# Patient Record
Sex: Female | Born: 1965 | Race: Black or African American | Hispanic: No | Marital: Single | Smoking: Current every day smoker
Health system: Southern US, Community
[De-identification: ages and names within clinical notes are randomized; demographics above are authoritative.]

## PROBLEM LIST (undated history)

## (undated) DIAGNOSIS — R7302 Impaired glucose tolerance (oral): Secondary | ICD-10-CM

## (undated) DIAGNOSIS — G43909 Migraine, unspecified, not intractable, without status migrainosus: Secondary | ICD-10-CM

## (undated) DIAGNOSIS — F419 Anxiety disorder, unspecified: Secondary | ICD-10-CM

## (undated) DIAGNOSIS — Z87442 Personal history of urinary calculi: Secondary | ICD-10-CM

## (undated) DIAGNOSIS — K5732 Diverticulitis of large intestine without perforation or abscess without bleeding: Secondary | ICD-10-CM

## (undated) DIAGNOSIS — L309 Dermatitis, unspecified: Secondary | ICD-10-CM

## (undated) DIAGNOSIS — R42 Dizziness and giddiness: Secondary | ICD-10-CM

## (undated) DIAGNOSIS — K5790 Diverticulosis of intestine, part unspecified, without perforation or abscess without bleeding: Secondary | ICD-10-CM

## (undated) DIAGNOSIS — G5600 Carpal tunnel syndrome, unspecified upper limb: Secondary | ICD-10-CM

## (undated) DIAGNOSIS — I10 Essential (primary) hypertension: Secondary | ICD-10-CM

## (undated) DIAGNOSIS — K219 Gastro-esophageal reflux disease without esophagitis: Secondary | ICD-10-CM

## (undated) DIAGNOSIS — M545 Low back pain, unspecified: Secondary | ICD-10-CM

## (undated) DIAGNOSIS — R002 Palpitations: Secondary | ICD-10-CM

## (undated) HISTORY — PX: INDUCED ABORTION: SHX677

## (undated) HISTORY — DX: Palpitations: R00.2

## (undated) HISTORY — PX: APPENDECTOMY: SHX54

## (undated) HISTORY — DX: Migraine, unspecified, not intractable, without status migrainosus: G43.909

## (undated) HISTORY — PX: OOPHORECTOMY: SHX86

## (undated) HISTORY — DX: Essential (primary) hypertension: I10

## (undated) HISTORY — DX: Diverticulosis of intestine, part unspecified, without perforation or abscess without bleeding: K57.90

## (undated) HISTORY — DX: Carpal tunnel syndrome, unspecified upper limb: G56.00

## (undated) HISTORY — DX: Low back pain, unspecified: M54.50

## (undated) HISTORY — PX: COLONOSCOPY: SHX174

## (undated) HISTORY — DX: Low back pain: M54.5

## (undated) HISTORY — PX: ABDOMINAL HYSTERECTOMY: SHX81

## (undated) HISTORY — DX: Dermatitis, unspecified: L30.9

## (undated) HISTORY — DX: Dizziness and giddiness: R42

## (undated) HISTORY — DX: Impaired glucose tolerance (oral): R73.02

## (undated) HISTORY — DX: Anxiety disorder, unspecified: F41.9

---

## 1996-11-26 HISTORY — PX: APPENDECTOMY: SHX54

## 2011-11-27 DIAGNOSIS — R7302 Impaired glucose tolerance (oral): Secondary | ICD-10-CM | POA: Insufficient documentation

## 2015-06-01 ENCOUNTER — Ambulatory Visit: Payer: Self-pay | Admitting: Family

## 2016-02-24 ENCOUNTER — Other Ambulatory Visit: Payer: Self-pay | Admitting: Nurse Practitioner

## 2016-02-24 DIAGNOSIS — Z1231 Encounter for screening mammogram for malignant neoplasm of breast: Secondary | ICD-10-CM

## 2016-03-26 ENCOUNTER — Emergency Department
Admission: EM | Admit: 2016-03-26 | Discharge: 2016-03-26 | Disposition: A | Discharge status: Home / Self Care | Payer: BLUE CROSS/BLUE SHIELD | Attending: Emergency Medicine | Admitting: Emergency Medicine

## 2016-03-26 ENCOUNTER — Other Ambulatory Visit: Payer: Self-pay

## 2016-03-26 DIAGNOSIS — R42 Dizziness and giddiness: Secondary | ICD-10-CM

## 2016-03-26 DIAGNOSIS — I10 Essential (primary) hypertension: Secondary | ICD-10-CM

## 2016-03-26 LAB — BASIC METABOLIC PANEL
ANION GAP: 10 (ref 5–15)
BUN: 31 mg/dL — AB (ref 6–20)
CALCIUM: 9.2 mg/dL (ref 8.9–10.3)
CO2: 27 mmol/L (ref 22–32)
Chloride: 104 mmol/L (ref 101–111)
Creatinine, Ser: 1.05 mg/dL — ABNORMAL HIGH (ref 0.44–1.00)
GFR calc Af Amer: >60 mL/min (ref >60)
GLUCOSE: 91 mg/dL (ref 65–99)
Potassium: 3.7 mmol/L (ref 3.5–5.1)
Sodium: 141 mmol/L (ref 135–145)

## 2016-03-26 LAB — CBC
HEMATOCRIT: 45.1 % (ref 35.0–47.0)
Hemoglobin: 15.2 g/dL (ref 12.0–16.0)
MCH: 28.1 pg (ref 26.0–34.0)
MCHC: 33.7 g/dL (ref 32.0–36.0)
MCV: 83.3 fL (ref 80.0–100.0)
PLATELETS: 241 10*3/uL (ref 150–440)
RBC: 5.42 MIL/uL — ABNORMAL HIGH (ref 3.80–5.20)
RDW: 16.5 % — AB (ref 11.5–14.5)
WBC: 9.4 10*3/uL (ref 3.6–11.0)

## 2016-03-26 NOTE — Discharge Instructions (Signed)
Dizziness Dizziness is a common problem. It is a feeling of unsteadiness or light-headedness. You may feel like you are about to faint. Dizziness can lead to injury if you stumble or fall. Anyone can become dizzy, but dizziness is more common in older adults. This condition can be caused by a number of things, including medicines, dehydration, or illness. HOME CARE INSTRUCTIONS Taking these steps may help with your condition: Eating and Drinking  Drink enough fluid to keep your urine clear or pale yellow. This helps to keep you from becoming dehydrated. Try to drink more clear fluids, such as water.  Do not drink alcohol.  Limit your caffeine intake if directed by your health care provider.  Limit your salt intake if directed by your health care provider. Activity  Avoid making quick movements.  Rise slowly from chairs and steady yourself until you feel okay.  In the morning, first sit up on the side of the bed. When you feel okay, stand slowly while you hold onto something until you know that your balance is fine.  Move your legs often if you need to stand in one place for a long time. Tighten and relax your muscles in your legs while you are standing.  Do not drive or operate heavy machinery if you feel dizzy.  Avoid bending down if you feel dizzy. Place items in your home so that they are easy for you to reach without leaning over. Lifestyle  Do not use any tobacco products, including cigarettes, chewing tobacco, or electronic cigarettes. If you need help quitting, ask your health care provider.  Try to reduce your stress level, such as with yoga or meditation. Talk with your health care provider if you need help. General Instructions  Watch your dizziness for any changes.  Take medicines only as directed by your health care provider. Talk with your health care provider if you think that your dizziness is caused by a medicine that you are taking.  Tell a friend or a family  member that you are feeling dizzy. If he or she notices any changes in your behavior, have this person call your health care provider.  Keep all follow-up visits as directed by your health care provider. This is important. SEEK MEDICAL CARE IF:  Your dizziness does not go away.  Your dizziness or light-headedness gets worse.  You feel nauseous.  You have reduced hearing.  You have new symptoms.  You are unsteady on your feet or you feel like the room is spinning. SEEK IMMEDIATE MEDICAL CARE IF:  You vomit or have diarrhea and are unable to eat or drink anything.  You have problems talking, walking, swallowing, or using your arms, hands, or legs.  You feel generally weak.  You are not thinking clearly or you have trouble forming sentences. It may take a friend or family member to notice this.  You have chest pain, abdominal pain, shortness of breath, or sweating.  Your vision changes.  You notice any bleeding.  You have a headache.  You have neck pain or a stiff neck.  You have a fever.   This information is not intended to replace advice given to you by your health care provider. Make sure you discuss any questions you have with your health care provider.   Document Released: 05/08/2001 Document Revised: 03/29/2015 Document Reviewed: 11/08/2014 Elsevier Interactive Patient Education 2016 [employer].  Hypertension Hypertension, commonly called high blood pressure, is when the force of blood pumping through your arteries is  too strong. Your arteries are the blood vessels that carry blood from your heart throughout your body. A blood pressure reading consists of a higher number over a lower number, such as 110/72. The higher number (systolic) is the pressure inside your arteries when your heart pumps. The lower number (diastolic) is the pressure inside your arteries when your heart relaxes. Ideally you want your blood pressure below 120/80. Hypertension forces your heart  to work harder to pump blood. Your arteries may become narrow or stiff. Having untreated or uncontrolled hypertension can cause heart attack, stroke, kidney disease, and other problems. RISK FACTORS Some risk factors for high blood pressure are controllable. Others are not.  Risk factors you cannot control include:   Race. You may be at higher risk if you are African American.  Age. Risk increases with age.  Gender. Men are at higher risk than women before age 18 years. After age 22, women are at higher risk than men. Risk factors you can control include:  Not getting enough exercise or physical activity.  Being overweight.  Getting too much fat, sugar, calories, or salt in your diet.  Drinking too much alcohol. SIGNS AND SYMPTOMS Hypertension does not usually cause signs or symptoms. Extremely high blood pressure (hypertensive crisis) may cause headache, anxiety, shortness of breath, and nosebleed. DIAGNOSIS To check if you have hypertension, your health care provider will measure your blood pressure while you are seated, with your arm held at the level of your heart. It should be measured at least twice using the same arm. Certain conditions can cause a difference in blood pressure between your right and left arms. A blood pressure reading that is higher than normal on one occasion does not mean that you need treatment. If it is not clear whether you have high blood pressure, you may be asked to return on a different day to have your blood pressure checked again. Or, you may be asked to monitor your blood pressure at home for 1 or more weeks. TREATMENT Treating high blood pressure includes making lifestyle changes and possibly taking medicine. Living a healthy lifestyle can help lower high blood pressure. You may need to change some of your habits. Lifestyle changes may include:  Following the DASH diet. This diet is high in fruits, vegetables, and whole grains. It is low in salt, red  meat, and added sugars.  Keep your sodium intake below 2,300 mg per day.  Getting at least 30-45 minutes of aerobic exercise at least 4 times per week.  Losing weight if necessary.  Not smoking.  Limiting alcoholic beverages.  Learning ways to reduce stress. Your health care provider may prescribe medicine if lifestyle changes are not enough to get your blood pressure under control, and if one of the following is true:  You are 78-68 years of age and your systolic blood pressure is above 140.  You are 43 years of age or older, and your systolic blood pressure is above 150.  Your diastolic blood pressure is above 90.  You have diabetes, and your systolic blood pressure is over [id number] or your diastolic blood pressure is over 90.  You have kidney disease and your blood pressure is above 140/90.  You have heart disease and your blood pressure is above 140/90. Your personal target blood pressure may vary depending on your medical conditions, your age, and other factors. HOME CARE INSTRUCTIONS  Have your blood pressure rechecked as directed by your health care provider.   Take medicines  only as directed by your health care provider. Follow the directions carefully. Blood pressure medicines must be taken as prescribed. The medicine does not work as well when you skip doses. Skipping doses also puts you at risk for problems.  Do not smoke.   Monitor your blood pressure at home as directed by your health care provider. SEEK MEDICAL CARE IF:   You think you are having a reaction to medicines taken.  You have recurrent headaches or feel dizzy.  You have swelling in your ankles.  You have trouble with your vision. SEEK IMMEDIATE MEDICAL CARE IF:  You develop a severe headache or confusion.  You have unusual weakness, numbness, or feel faint.  You have severe chest or abdominal pain.  You vomit repeatedly.  You have trouble breathing. MAKE SURE YOU:   Understand these  instructions.  Will watch your condition.  Will get help right away if you are not doing well or get worse.   This information is not intended to replace advice given to you by your health care provider. Make sure you discuss any questions you have with your health care provider.   Document Released: 11/12/2005 Document Revised: 03/29/2015 Document Reviewed: 09/04/2013 Elsevier Interactive Patient Education 2016 [employer].  How to Take Your Blood Pressure HOW DO I GET A BLOOD PRESSURE MACHINE?  You can buy an electronic home blood pressure machine at your local pharmacy. Insurance will sometimes cover the cost if you have a prescription.  Ask your doctor what type of machine is best for you. There are different machines for your arm and your wrist.  If you decide to buy a machine to check your blood pressure on your arm, first check the size of your arm so you can buy the right size cuff. To check the size of your arm:   Use a measuring tape that shows both inches and centimeters.   Wrap the measuring tape around the upper-middle part of your arm. You may need someone to help you measure.   Write down your arm measurement in both inches and centimeters.   To measure your blood pressure correctly, it is important to have the right size cuff.   If your arm is up to 13 inches (up to 34 centimeters), get an adult cuff size.  If your arm is 13 to 17 inches (35 to 44 centimeters), get a large adult cuff size.    If your arm is 17 to 20 inches (45 to 52 centimeters), get an adult thigh cuff.  WHAT DO THE NUMBERS MEAN?   There are two numbers that make up your blood pressure. For example: 120/80.  The first number (120 in our example) is called the "systolic pressure." It is a measure of the pressure in your blood vessels when your heart is pumping blood.  The second number (80 in our example) is called the "diastolic pressure." It is a measure of the pressure in your blood  vessels when your heart is resting between beats.  Your doctor will tell you what your blood pressure should be. WHAT SHOULD I DO BEFORE I CHECK MY BLOOD PRESSURE?   Try to rest or relax for at least 30 minutes before you check your blood pressure.  Do not smoke.  Do not have any drinks with caffeine, such as:  Soda.  Coffee.  Tea.  Check your blood pressure in a quiet room.  Sit down and stretch out your arm on a table. Keep your arm  at about the level of your heart. Let your arm relax.  Make sure that your legs are not crossed. HOW DO I CHECK MY BLOOD PRESSURE?  Follow the directions that came with your machine.  Make sure you remove any tight-fitting clothing from your arm or wrist. Wrap the cuff around your upper arm or wrist. You should be able to fit a finger between the cuff and your arm. If you cannot fit a finger between the cuff and your arm, it is too tight and should be removed and rewrapped.  Some units require you to manually pump up the arm cuff.  Automatic units inflate the cuff when you press a button.  Cuff deflation is automatic in both models.  After the cuff is inflated, the unit measures your blood pressure and pulse. The readings are shown on a monitor. Hold still and breathe normally while the cuff is inflated.  Getting a reading takes less than a minute.  Some models store readings in a memory. Some provide a printout of readings. If your machine does not store your readings, keep a written record.  Take readings with you to your next visit with your doctor.   This information is not intended to replace advice given to you by your health care provider. Make sure you discuss any questions you have with your health care provider.   Document Released: 10/25/2008 Document Revised: 12/03/2014 Document Reviewed: 01/07/2014 Elsevier Interactive Patient Education [employer]2016 Elsevier Inc.

## 2016-03-26 NOTE — ED Provider Notes (Signed)
[facility] Emergency Department Provider Note  ____________________________________________  Time seen: 8:55 AM  I have reviewed the triage vital signs and the nursing notes.   HISTORY  Chief Complaint Dizziness    HPI Luvenia Reddenonya Siracusa is a 50 y.o. female who complains of intermittent dizziness over the past 2 weeks. She associates this with increases of her blood pressure, which goes up to as high as about 150/90. He has been following up with her doctor, who started her on 2.5 mg amlodipine 3 days ago. She's been taking this. She also takes losartan and HCTZ. She takes all her medications at the same time every day at 8 PM, although sometimes she changes it and takes it at a different time of the day. This partly depends on her work schedule as she does work third shift. Denies any chest pain shortness of breath or syncope. No falls. No aggravating or alleviating factors, not orthostatic. Has been eating and drinking normally, no recent illnesses.     No past medical history on file. Hypertension  There are no active problems to display for this patient.    No past surgical history on file. None  No current outpatient prescriptions on file. Amlodipine Losartan HCTZ Diclofenac  Allergies Review of patient's allergies indicates not on file.   No family history on file.  Social History Social History  Substance Use Topics  . Smoking status: Not on file  . Smokeless tobacco: Not on file  . Alcohol Use: Not on file  No tobacco alcohol or drug use  Review of Systems  Constitutional:   No fever or chills.  Eyes:   No vision changes.  ENT:   No sore throat. No rhinorrhea. Cardiovascular:   No chest pain. Respiratory:   No dyspnea or cough. Gastrointestinal:   Negative for abdominal pain, vomiting and diarrhea.  Genitourinary:   Negative for dysuria or difficulty urinating. Musculoskeletal:   Negative for focal pain or swelling Neurological:    Negative for headaches. Intermittent dizziness. 10-point ROS otherwise negative.  ____________________________________________   PHYSICAL EXAM:  VITAL SIGNS: ED Triage Vitals  Enc Vitals Group     BP 03/26/16 0517 134/90 mmHg     Pulse Rate 03/26/16 0517 77     Resp 03/26/16 0517 18     Temp 03/26/16 0517 98.1 F (36.7 C)     Temp Source 03/26/16 0517 Oral     SpO2 03/26/16 0517 96 %     Weight --      Height 03/26/16 0517 5\' 1"  (1.549 m)     Head Cir --      Peak Flow --      Pain Score 03/26/16 0820 3     Pain Loc --      Pain Edu? --      Excl. in GC? --     Vital signs reviewed, nursing assessments reviewed.   Constitutional:   Alert and oriented. Well appearing and in no distress. Eyes:   No scleral icterus. No conjunctival pallor. PERRL. EOMI.  No nystagmus. ENT   Head:   Normocephalic and atraumatic.TMs normal bilaterally   Nose:   No congestion/rhinnorhea. No septal hematoma   Mouth/Throat:   MMM, no pharyngeal erythema. No peritonsillar mass.    Neck:   No stridor. No SubQ emphysema. No meningismus. Hematological/Lymphatic/Immunilogical:   No cervical lymphadenopathy. Cardiovascular:   RRR. Symmetric bilateral radial and DP pulses.  No murmurs.  Respiratory:   Normal respiratory effort without tachypnea  nor retractions. Breath sounds are clear and equal bilaterally. No wheezes/rales/rhonchi. Gastrointestinal:   Soft and nontender. Non distended. There is no CVA tenderness.  No rebound, rigidity, or guarding. Genitourinary:   deferred Musculoskeletal:   Nontender with normal range of motion in all extremities. No joint effusions.  No lower extremity tenderness.  No edema. Neurologic:   Normal speech and language.  CN 2-10 normal. Motor grossly intact. Normal balance and coordination. Normal gait. No pronator drift. No gross focal neurologic deficits are appreciated.  Skin:    Skin is warm, dry and intact. No rash noted.  No petechiae, purpura,  or bullae.  ____________________________________________    LABS (pertinent positives/negatives) (all labs ordered are listed, but only abnormal results are displayed) Labs Reviewed  BASIC METABOLIC PANEL - Abnormal; Notable for the following:    BUN 31 (*)    Creatinine, Ser 1.05 (*)    All other components within normal limits  CBC - Abnormal; Notable for the following:    RBC 5.42 (*)    RDW 16.5 (*)    All other components within normal limits   ____________________________________________   EKG  Interpreted by me  Date: 03/26/2016  Rate: 73  Rhythm: normal sinus rhythm  QRS Axis: normal  Intervals: normal  ST/T Wave abnormalities: normal  Conduction Disutrbances: none  Narrative Interpretation: unremarkable      ____________________________________________    RADIOLOGY    ____________________________________________   PROCEDURES   ____________________________________________   INITIAL IMPRESSION / ASSESSMENT AND PLAN / ED COURSE  Pertinent labs & imaging results that were available during my care of the patient were reviewed by me and considered in my medical decision making (see chart for details).  Patient well appearing no acute distress. Blood pressure controlled at this time 130/100. Counseled to continue taking medications and follow-up with primary care. Amlodipine may not have reached peak effect yet. Neurologically intact, no worrisome features for dizziness. Counseled the patient to ensure that she is staying well-hydrated specially with Summer temperatures approaching. She'll follow-up with her doctor in about 3 days for titration of blood pressure medication as needed.Considering the patient's symptoms, medical history, and physical examination today, I have low suspicion for ischemic stroke, intracranial hemorrhage, meningitis, encephalitis, carotid or vertebral dissection, venous sinus thrombosis, MS, intracranial hypertension, glaucoma,  CRAO, CRVO, or temporal arteritis.      ____________________________________________   FINAL CLINICAL IMPRESSION(S) / ED DIAGNOSES  Final diagnoses:  Dizziness  Essential hypertension       Portions of this note were generated with dragon dictation software. Dictation errors may occur despite best attempts at proofreading.   Sharman Cheek, MD 03/26/16 [phone]

## 2016-03-26 NOTE — ED Notes (Signed)
Patient reports over the past 2 weeks her blood pressure has been "spiking".  States she take hypertensive medications as prescribed.  States she can feel fine and then suddenly feel dizzy and like she is going to pass out.

## 2017-06-24 ENCOUNTER — Ambulatory Visit: Payer: BLUE CROSS/BLUE SHIELD | Admitting: Cardiovascular Disease

## 2017-11-24 ENCOUNTER — Encounter: Payer: Self-pay | Admitting: Intensive Care

## 2017-11-24 ENCOUNTER — Emergency Department: Payer: BLUE CROSS/BLUE SHIELD

## 2017-11-24 ENCOUNTER — Emergency Department
Admission: EM | Admit: 2017-11-24 | Discharge: 2017-11-24 | Disposition: A | Discharge status: Home / Self Care | Payer: BLUE CROSS/BLUE SHIELD | Attending: Student in an Organized Health Care Education/Training Program | Admitting: Student in an Organized Health Care Education/Training Program

## 2017-11-24 DIAGNOSIS — R110 Nausea: Secondary | ICD-10-CM | POA: Insufficient documentation

## 2017-11-24 DIAGNOSIS — R1032 Left lower quadrant pain: Secondary | ICD-10-CM | POA: Diagnosis not present

## 2017-11-24 DIAGNOSIS — F17210 Nicotine dependence, cigarettes, uncomplicated: Secondary | ICD-10-CM | POA: Insufficient documentation

## 2017-11-24 DIAGNOSIS — I10 Essential (primary) hypertension: Secondary | ICD-10-CM | POA: Insufficient documentation

## 2017-11-24 DIAGNOSIS — Z79899 Other long term (current) drug therapy: Secondary | ICD-10-CM | POA: Diagnosis not present

## 2017-11-24 DIAGNOSIS — R109 Unspecified abdominal pain: Secondary | ICD-10-CM | POA: Diagnosis present

## 2017-11-24 DIAGNOSIS — K5732 Diverticulitis of large intestine without perforation or abscess without bleeding: Secondary | ICD-10-CM | POA: Diagnosis not present

## 2017-11-24 LAB — URINALYSIS, COMPLETE (UACMP) WITH MICROSCOPIC
BACTERIA UA: NONE SEEN
Bilirubin Urine: NEGATIVE
Glucose, UA: NEGATIVE mg/dL
Ketones, ur: NEGATIVE mg/dL
Leukocytes, UA: NEGATIVE
Nitrite: NEGATIVE
Protein, ur: NEGATIVE mg/dL
SPECIFIC GRAVITY, URINE: 1.034 — AB (ref 1.005–1.030)
pH: 6 (ref 5.0–8.0)

## 2017-11-24 LAB — CBC WITH DIFFERENTIAL/PLATELET
Basophils Absolute: 0.1 10*3/uL (ref 0–0.1)
Basophils Relative: 1 %
Eosinophils Absolute: 0.2 10*3/uL (ref 0–0.7)
Eosinophils Relative: 2 %
HCT: 44.6 % (ref 35.0–47.0)
HEMOGLOBIN: 15.2 g/dL (ref 12.0–16.0)
LYMPHS ABS: 1.3 10*3/uL (ref 1.0–3.6)
LYMPHS PCT: 17 %
MCH: 29 pg (ref 26.0–34.0)
MCHC: 34.1 g/dL (ref 32.0–36.0)
MCV: 84.9 fL (ref 80.0–100.0)
Monocytes Absolute: 0.7 10*3/uL (ref 0.2–0.9)
Monocytes Relative: 9 %
NEUTROS ABS: 5.5 10*3/uL (ref 1.4–6.5)
NEUTROS PCT: 71 %
Platelets: 286 10*3/uL (ref 150–440)
RBC: 5.25 MIL/uL — AB (ref 3.80–5.20)
RDW: 14.2 % (ref 11.5–14.5)
WBC: 7.8 10*3/uL (ref 3.6–11.0)

## 2017-11-24 LAB — COMPREHENSIVE METABOLIC PANEL
ALK PHOS: 75 U/L (ref 38–126)
ALT: 17 U/L (ref 14–54)
AST: 21 U/L (ref 15–41)
Albumin: 3.8 g/dL (ref 3.5–5.0)
Anion gap: 10 (ref 5–15)
BUN: 15 mg/dL (ref 6–20)
CHLORIDE: 101 mmol/L (ref 101–111)
CO2: 26 mmol/L (ref 22–32)
CREATININE: 0.79 mg/dL (ref 0.44–1.00)
Calcium: 9.1 mg/dL (ref 8.9–10.3)
GFR calc Af Amer: >60 mL/min (ref >60)
Glucose, Bld: 99 mg/dL (ref 65–99)
Potassium: 3.8 mmol/L (ref 3.5–5.1)
Sodium: 137 mmol/L (ref 135–145)
Total Bilirubin: 0.9 mg/dL (ref 0.3–1.2)
Total Protein: 7.9 g/dL (ref 6.5–8.1)

## 2017-11-24 LAB — LIPASE, BLOOD: LIPASE: 30 U/L (ref 11–51)

## 2017-11-24 MED ORDER — IOPAMIDOL (ISOVUE-300) INJECTION 61%
100.0000 mL | Freq: Once | INTRAVENOUS | Status: AC | PRN
  Administered 2017-11-24: 100 mL via INTRAVENOUS

## 2017-11-24 MED ORDER — PROMETHAZINE HCL 12.5 MG PO TABS: 12.5000 mg | ORAL_TABLET | Freq: Four times a day (QID) | ORAL | 0 refills | Status: DC | PRN

## 2017-11-24 MED ORDER — SODIUM CHLORIDE 0.9 % IV BOLUS (SEPSIS)
500.0000 mL | Freq: Once | INTRAVENOUS | Status: AC
  Administered 2017-11-24: 500 mL via INTRAVENOUS

## 2017-11-24 MED ORDER — PROMETHAZINE HCL 25 MG/ML IJ SOLN
12.5000 mg | Freq: Four times a day (QID) | INTRAMUSCULAR | Status: DC | PRN
  Administered 2017-11-24: 12.5 mg via INTRAVENOUS
  Filled 2017-11-24: qty 1

## 2017-11-24 MED ORDER — HYDROCODONE-ACETAMINOPHEN 5-325 MG PO TABS: 1.0000 | ORAL_TABLET | ORAL | 0 refills | Status: DC | PRN

## 2017-11-24 MED ORDER — MORPHINE SULFATE (PF) 4 MG/ML IV SOLN
4.0000 mg | INTRAVENOUS | Status: DC | PRN
  Administered 2017-11-24: 4 mg via INTRAVENOUS
  Filled 2017-11-24: qty 1

## 2017-11-24 NOTE — ED Provider Notes (Signed)
[facility] Emergency Department Provider Note    First MD Initiated Contact with Patient 11/24/17 [phone]     (approximate)  I have reviewed the triage vital signs and the nursing notes.   HISTORY  Chief Complaint Abdominal Pain    HPI Adrienne Adkins is a 51 y.o. female with no recent hospitalizations but recent initiation of antibiotic treatment for presumed diverticulitis presents to the ER for worsening pain nausea discomfort.  Pain is described as mild to moderate particular on the left side.  No radiation to the back.  Has a history of diverticulitis.  States that she has been having chills but no measured fevers.  No chest pain or shortness of breath.  No lower extremity swelling.  Denies any history of stones.  No hematuria or dysuria.  Past Medical History:  Diagnosis Date  . Anxiety   . Carpal tunnel syndrome   . Dermatitis   . Diverticular disease   . Hypertension   . IGT (impaired glucose tolerance)   . Lumbago   . Migraines   . Palpitations   . Vertigo    History reviewed. No pertinent family history. Past Surgical History:  Procedure Laterality Date  . ABDOMINAL HYSTERECTOMY    . APPENDECTOMY    . COLONOSCOPY    . INDUCED ABORTION    . OOPHORECTOMY     There are no active problems to display for this patient.     Prior to Admission medications   Medication Sig Start Date End Date Taking? Authorizing Provider  amLODipine (NORVASC) 2.5 MG tablet Take 2.5 mg by mouth daily. 10/11/16  Yes [provider]  ciprofloxacin (CIPRO) 500 MG tablet Take 500 mg by mouth 2 (two) times daily.   Yes [provider]  metroNIDAZOLE (FLAGYL) 500 MG tablet Take 500 mg by mouth 3 (three) times daily.   Yes [provider]  omeprazole (PRILOSEC) 20 MG capsule Take 20 mg by mouth daily.   Yes [provider]  saccharomyces boulardii (FLORASTOR) 250 MG capsule Take 250 mg by mouth daily.   Yes [provider]   valsartan-hydrochlorothiazide (DIOVAN-HCT) 320-25 MG tablet Take 1 tablet by mouth daily.    Yes [provider]  Artificial Tear Solution (20/20 ARTIFICIAL TEARS OP) Take 20 mg by mouth every other day.    [provider]  HYDROcodone-acetaminophen (NORCO) 5-325 MG tablet Take 1 tablet by mouth every 4 (four) hours as needed for moderate pain. 11/24/17   Willy Eddyobinson, Kween Bacorn, MD  promethazine (PHENERGAN) 12.5 MG tablet Take 1 tablet (12.5 mg total) by mouth every 6 (six) hours as needed for nausea or vomiting. 11/24/17   Willy Eddyobinson, Naliah Eddington, MD    Allergies Beta adrenergic blockers; Tramadol; and Amoxicillin    Social History Social History   Tobacco Use  . Smoking status: Current Every Day Smoker    Types: Cigarettes  . Smokeless tobacco: Never Used  Substance Use Topics  . Alcohol use: Yes  . Drug use: No    Review of Systems Patient denies headaches, rhinorrhea, blurry vision, numbness, shortness of breath, chest pain, edema, cough, abdominal pain, nausea, vomiting, diarrhea, dysuria, fevers, rashes or hallucinations unless otherwise stated above in HPI. ____________________________________________   PHYSICAL EXAM:  VITAL SIGNS: Vitals:   11/24/17 0900 11/24/17 0930  BP: 114/85 (!) 124/92  Pulse: 67 72  Resp:    Temp:    SpO2: 96% 96%    Constitutional: Alert and oriented. Well appearing and in no acute distress.  Eyes: Conjunctivae are normal.  Head: Atraumatic. Nose: No congestion/rhinnorhea. Mouth/Throat: Mucous membranes are moist.   Neck: No stridor. Painless ROM.  Cardiovascular: Normal rate, regular rhythm. Grossly normal heart sounds.  Good peripheral circulation. Respiratory: Normal respiratory effort.  No retractions. Lungs CTAB. Gastrointestinal: Soft with + ttp of LLQ. No distention. No abdominal bruits. No CVA tenderness. Genitourinary:  Musculoskeletal: No lower extremity tenderness nor edema.  No joint effusions. Neurologic:  Normal  speech and language. No gross focal neurologic deficits are appreciated. No facial droop Skin:  Skin is warm, dry and intact. No rash noted. Psychiatric: Mood and affect are normal. Speech and behavior are normal.  ____________________________________________   LABS (all labs ordered are listed, but only abnormal results are displayed)  Results for orders placed or performed during the hospital encounter of 11/24/17 (from the past 24 hour(s))  Lipase, blood     Status: None   Collection Time: 11/24/17  7:15 AM  Result Value Ref Range   Lipase 30 11 - 51 U/L  Comprehensive metabolic panel     Status: None   Collection Time: 11/24/17  7:15 AM  Result Value Ref Range   Sodium 137 135 - 145 mmol/L   Potassium 3.8 3.5 - 5.1 mmol/L   Chloride 101 101 - 111 mmol/L   CO2 26 22 - 32 mmol/L   Glucose, Bld 99 65 - 99 mg/dL   BUN 15 6 - 20 mg/dL   Creatinine, Ser 1.61 0.44 - 1.00 mg/dL   Calcium 9.1 8.9 - 09.6 mg/dL   Total Protein 7.9 6.5 - 8.1 g/dL   Albumin 3.8 3.5 - 5.0 g/dL   AST 21 15 - 41 U/L   ALT 17 14 - 54 U/L   Alkaline Phosphatase 75 38 - 126 U/L   Total Bilirubin 0.9 0.3 - 1.2 mg/dL   GFR calc non Af Amer >60 >60 mL/min   GFR calc Af Amer >60 >60 mL/min   Anion gap 10 5 - 15  CBC with Differential/Platelet     Status: Abnormal   Collection Time: 11/24/17  7:15 AM  Result Value Ref Range   WBC 7.8 3.6 - 11.0 K/uL   RBC 5.25 (H) 3.80 - 5.20 MIL/uL   Hemoglobin 15.2 12.0 - 16.0 g/dL   HCT 04.5 40.9 - 81.1 %   MCV 84.9 80.0 - 100.0 fL   MCH 29.0 26.0 - 34.0 pg   MCHC 34.1 32.0 - 36.0 g/dL   RDW 91.4 78.2 - 95.6 %   Platelets 286 150 - 440 K/uL   Neutrophils Relative % 71 %   Neutro Abs 5.5 1.4 - 6.5 K/uL   Lymphocytes Relative 17 %   Lymphs Abs 1.3 1.0 - 3.6 K/uL   Monocytes Relative 9 %   Monocytes Absolute 0.7 0.2 - 0.9 K/uL   Eosinophils Relative 2 %   Eosinophils Absolute 0.2 0 - 0.7 K/uL   Basophils Relative 1 %   Basophils Absolute 0.1 0 - 0.1 K/uL    ____________________________________________  ________________________________  RADIOLOGY  I personally reviewed all radiographic images ordered to evaluate for the above acute complaints and reviewed radiology reports and findings.  These findings were personally discussed with the patient.  Please see medical record for radiology report.  ____________________________________________   PROCEDURES  Procedure(s) performed:  Procedures    Critical Care performed: no ____________________________________________   INITIAL IMPRESSION / ASSESSMENT AND PLAN / ED COURSE  Pertinent labs & imaging results that were available during  my care of the patient were reviewed by me and considered in my medical decision making (see chart for details).  DDX: Diverticulitis, colitis, mass, obstruction, stone, pyelonephritis, muscular skeletal strain, shingles  Adrienne Reddenonya Maus is a 51 y.o. who presents to the ED with left lower quadrant abdominal pain as described above.  She has a low-grade temperature but is not tachycardic and well perfused.  Based on the pain with concern for possibly treated diverticular abscess or perforation CT imaging ordered for the above differential.  CT does confirm what appears to be uncomplicated diverticulitis.  Her blood work is reassuring.  She was given IV fluids for nausea and concern for dehydration.  She was given IV pain medications with resolution of her discomfort.  Patient able to tolerate oral hydration.  Will be discharged with antinausea medication as well as pain medication with referral to GI.  Have discussed with the patient and available family all diagnostics and treatments performed thus far and all questions were answered to the best of my ability. The patient demonstrates understanding and agreement with plan.       ____________________________________________   FINAL CLINICAL IMPRESSION(S) / ED DIAGNOSES  Final diagnoses:  Left lower quadrant  pain  Sigmoid diverticulitis      NEW MEDICATIONS STARTED DURING THIS VISIT:  This SmartLink is deprecated. Use AVSMEDLIST instead to display the medication list for a patient.   Note:  This document was prepared using Dragon voice recognition software and may include unintentional dictation errors.    Willy Eddyobinson, Braxston Quinter, MD 11/24/17 [phone]

## 2017-11-24 NOTE — Discharge Instructions (Signed)

## 2017-11-24 NOTE — ED Triage Notes (Addendum)
Patient was recently seen at [facility] for abdominal pain and was given medications that have not helped with pain. No blood work or urine was taken at [facility]. HX diverticulitis

## 2018-06-24 ENCOUNTER — Other Ambulatory Visit: Payer: Self-pay | Admitting: Physical Medicine and Rehabilitation

## 2018-06-24 DIAGNOSIS — M542 Cervicalgia: Secondary | ICD-10-CM

## 2018-07-04 ENCOUNTER — Ambulatory Visit: Admission: RE | Admit: 2018-07-04 | Payer: BLUE CROSS/BLUE SHIELD | Source: Ambulatory Visit

## 2018-09-10 ENCOUNTER — Other Ambulatory Visit: Payer: Self-pay | Admitting: Internal Medicine

## 2018-09-10 DIAGNOSIS — Z1231 Encounter for screening mammogram for malignant neoplasm of breast: Secondary | ICD-10-CM

## 2018-10-02 ENCOUNTER — Ambulatory Visit
Admission: RE | Admit: 2018-10-02 | Discharge: 2018-10-02 | Disposition: A | Discharge status: Home / Self Care | Payer: BLUE CROSS/BLUE SHIELD | Source: Ambulatory Visit | Attending: Internal Medicine | Admitting: Internal Medicine

## 2018-10-02 DIAGNOSIS — Z1231 Encounter for screening mammogram for malignant neoplasm of breast: Secondary | ICD-10-CM | POA: Diagnosis not present

## 2018-12-25 ENCOUNTER — Emergency Department
Admission: EM | Admit: 2018-12-25 | Discharge: 2018-12-25 | Disposition: A | Discharge status: Home / Self Care | Payer: BLUE CROSS/BLUE SHIELD | Attending: Emergency Medicine | Admitting: Emergency Medicine

## 2018-12-25 ENCOUNTER — Other Ambulatory Visit: Payer: Self-pay

## 2018-12-25 ENCOUNTER — Encounter: Payer: Self-pay | Admitting: Emergency Medicine

## 2018-12-25 DIAGNOSIS — I10 Essential (primary) hypertension: Secondary | ICD-10-CM | POA: Diagnosis not present

## 2018-12-25 DIAGNOSIS — R0981 Nasal congestion: Secondary | ICD-10-CM | POA: Insufficient documentation

## 2018-12-25 DIAGNOSIS — J101 Influenza due to other identified influenza virus with other respiratory manifestations: Secondary | ICD-10-CM

## 2018-12-25 DIAGNOSIS — M791 Myalgia, unspecified site: Secondary | ICD-10-CM | POA: Diagnosis not present

## 2018-12-25 DIAGNOSIS — R509 Fever, unspecified: Secondary | ICD-10-CM | POA: Insufficient documentation

## 2018-12-25 DIAGNOSIS — Z79899 Other long term (current) drug therapy: Secondary | ICD-10-CM | POA: Diagnosis not present

## 2018-12-25 DIAGNOSIS — F17210 Nicotine dependence, cigarettes, uncomplicated: Secondary | ICD-10-CM | POA: Insufficient documentation

## 2018-12-25 DIAGNOSIS — R05 Cough: Secondary | ICD-10-CM | POA: Diagnosis present

## 2018-12-25 LAB — INFLUENZA PANEL BY PCR (TYPE A & B)
Influenza A By PCR: POSITIVE — AB
Influenza B By PCR: NEGATIVE

## 2018-12-25 MED ORDER — OSELTAMIVIR PHOSPHATE 75 MG PO CAPS: 75.0000 mg | ORAL_CAPSULE | Freq: Two times a day (BID) | ORAL | 0 refills | Status: AC

## 2018-12-25 MED ORDER — HYDROCOD POLST-CPM POLST ER 10-8 MG/5ML PO SUER: 5.0000 mL | Freq: Two times a day (BID) | ORAL | 0 refills | Status: DC | PRN

## 2018-12-25 MED ORDER — ACETAMINOPHEN 500 MG PO TABS
1000.0000 mg | ORAL_TABLET | Freq: Once | ORAL | Status: AC
  Administered 2018-12-25: 1000 mg via ORAL
  Filled 2018-12-25: qty 2

## 2018-12-25 MED ORDER — OSELTAMIVIR PHOSPHATE 75 MG PO CAPS
75.0000 mg | ORAL_CAPSULE | Freq: Once | ORAL | Status: AC
  Administered 2018-12-25: 75 mg via ORAL
  Filled 2018-12-25: qty 1

## 2018-12-25 MED ORDER — HYDROCOD POLST-CPM POLST ER 10-8 MG/5ML PO SUER
5.0000 mL | Freq: Once | ORAL | Status: AC
  Administered 2018-12-25: 5 mL via ORAL
  Filled 2018-12-25: qty 5

## 2018-12-25 NOTE — ED Triage Notes (Signed)
Patient ambulatory to triage with steady gait, without difficulty or distress noted, mask in place; pt reports x 2 days having fever, body aches and cough

## 2018-12-25 NOTE — ED Provider Notes (Signed)
[facility] Emergency Department Provider Note {____________   First MD Initiated Contact with Patient 12/25/18 [phone]     (approximate)  I have reviewed the triage vital signs and the nursing notes.   HISTORY  Chief Complaint Fever   HPI Adrienne Adkins is a 53 y.o. female with below list of chronic medical conditions presents to the emergency department with lower 2-day history of cough congestion fever generalized myalgia.  Patient with known sick contact with influenza.   Past Medical History:  Diagnosis Date  . Anxiety   . Carpal tunnel syndrome   . Dermatitis   . Diverticular disease   . Hypertension   . IGT (impaired glucose tolerance)   . Lumbago   . Migraines   . Palpitations   . Vertigo     There are no active problems to display for this patient.   Past Surgical History:  Procedure Laterality Date  . ABDOMINAL HYSTERECTOMY    . APPENDECTOMY    . COLONOSCOPY    . INDUCED ABORTION    . OOPHORECTOMY      Prior to Admission medications   Medication Sig Start Date End Date Taking? Authorizing Provider  amLODipine (NORVASC) 2.5 MG tablet Take 2.5 mg by mouth daily. 10/11/16   [provider]  Artificial Tear Solution (20/20 ARTIFICIAL TEARS OP) Take 20 mg by mouth every other day.    [provider]  ciprofloxacin (CIPRO) 500 MG tablet Take 500 mg by mouth 2 (two) times daily.    [provider]  HYDROcodone-acetaminophen (NORCO) 5-325 MG tablet Take 1 tablet by mouth every 4 (four) hours as needed for moderate pain. 11/24/17   Willy Eddy, MD  metroNIDAZOLE (FLAGYL) 500 MG tablet Take 500 mg by mouth 3 (three) times daily.    [provider]  omeprazole (PRILOSEC) 20 MG capsule Take 20 mg by mouth daily.    [provider]  promethazine (PHENERGAN) 12.5 MG tablet Take 1 tablet (12.5 mg total) by mouth every 6 (six) hours as needed for nausea or vomiting. 11/24/17   Willy Eddy, MD   saccharomyces boulardii (FLORASTOR) 250 MG capsule Take 250 mg by mouth daily.    [provider]  valsartan-hydrochlorothiazide (DIOVAN-HCT) 320-25 MG tablet Take 1 tablet by mouth daily.     [provider]    Allergies Beta adrenergic blockers; Tramadol; and Amoxicillin  Family History  Problem Relation Age of Onset  . Breast cancer Maternal Aunt     Social History Social History   Tobacco Use  . Smoking status: Current Every Day Smoker    Types: Cigarettes  . Smokeless tobacco: Never Used  Substance Use Topics  . Alcohol use: Yes  . Drug use: No    Review of Systems Constitutional: Positive for fever/chills Eyes: No visual changes. ENT: No sore throat.  Positive for nasal congestion Cardiovascular: Denies chest pain. Respiratory: Denies shortness of breath.  Positive for cough Gastrointestinal: No abdominal pain.  No nausea, no vomiting.  No diarrhea.  No constipation. Genitourinary: Negative for dysuria. Musculoskeletal: Negative for neck pain.  Negative for back pain. Integumentary: Negative for rash. Neurological: Negative for headaches, focal weakness or numbness.   ____________________________________________   PHYSICAL EXAM:  VITAL SIGNS: ED Triage Vitals  Enc Vitals Group     BP 12/25/18 0424 (!) 162/100     Pulse Rate 12/25/18 0424 (!) 115     Resp 12/25/18 0424 18     Temp 12/25/18 0424 (!) 101  F (38.3 C)     Temp Source 12/25/18 0424 Oral     SpO2 12/25/18 0424 95 %     Weight 12/25/18 0423 81.6 kg (180 lb)     Height 12/25/18 0423 1.549 m (5\' 1" )     Head Circumference --      Peak Flow --      Pain Score 12/25/18 0423 10     Pain Loc --      Pain Edu? --      Excl. in GC? --     Constitutional: Alert and oriented.  Coughing  eyes: Conjunctivae are normal.  Nose: Positive for congestion/rhinnorhea. Mouth/Throat: Mucous membranes are moist. Oropharynx non-erythematous. Neck: No stridor.   Cardiovascular: Normal  rate, regular rhythm. Good peripheral circulation. Grossly normal heart sounds. Respiratory: Normal respiratory effort.  No retractions. Lungs CTAB. Gastrointestinal: Soft and nontender. No distention.  Musculoskeletal: No lower extremity tenderness nor edema. No gross deformities of extremities. Neurologic:  Normal speech and language. No gross focal neurologic deficits are appreciated.  Skin:  Skin is warm, dry and intact. No rash noted. Psychiatric: Mood and affect are normal. Speech and behavior are normal.  ____________________________________________   LABS (all labs ordered are listed, but only abnormal results are displayed)  Labs Reviewed  INFLUENZA PANEL BY PCR (TYPE A & B) - Abnormal; Notable for the following components:      Result Value   Influenza A By PCR POSITIVE (*)    All other components within normal limits   ___________________ Procedures   ____________________________________________   INITIAL IMPRESSION / ASSESSMENT AND PLAN / ED COURSE  As part of my medical decision making, I reviewed the following data within the electronic MEDICAL RECORD NUMBER   53 year old female presented with above-stated history and physical exam consistent with influenza which was confirmed on nasal swab.  Patient positive for influenza A.  Spoke with patient at length regarding Tamiflu including the side effects which she requested and as such it was prescribed.  Patient also given Tussionex and advised not to drive or operate machinery while taking it as it could result in somnolence    ____________________________________________  FINAL CLINICAL IMPRESSION(S) / ED DIAGNOSES  Final diagnoses:  Influenza A     MEDICATIONS GIVEN DURING THIS VISIT:  Medications  acetaminophen (TYLENOL) tablet 1,000 mg (1,000 mg Oral Given 12/25/18 0428)     ED Discharge Orders    None       Note:  This document was prepared using Dragon voice recognition software and may include  unintentional dictation errors.    Darci Current, MD 12/25/18 [phone]

## 2019-06-04 ENCOUNTER — Other Ambulatory Visit: Payer: Self-pay | Admitting: *Deleted

## 2019-06-04 DIAGNOSIS — Z20822 Contact with and (suspected) exposure to covid-19: Secondary | ICD-10-CM

## 2019-06-09 LAB — NOVEL CORONAVIRUS, NAA: SARS-CoV-2, NAA: NOT DETECTED

## 2019-06-27 DIAGNOSIS — U071 COVID-19: Secondary | ICD-10-CM

## 2019-06-27 HISTORY — DX: COVID-19: U07.1

## 2019-09-23 ENCOUNTER — Encounter: Payer: Self-pay | Admitting: Emergency Medicine

## 2019-09-23 ENCOUNTER — Emergency Department
Admission: EM | Admit: 2019-09-23 | Discharge: 2019-09-23 | Disposition: A | Discharge status: Home / Self Care | Payer: BC Managed Care – PPO | Attending: Emergency Medicine | Admitting: Emergency Medicine

## 2019-09-23 ENCOUNTER — Other Ambulatory Visit: Payer: Self-pay

## 2019-09-23 ENCOUNTER — Emergency Department: Payer: BC Managed Care – PPO

## 2019-09-23 DIAGNOSIS — R0789 Other chest pain: Secondary | ICD-10-CM | POA: Insufficient documentation

## 2019-09-23 DIAGNOSIS — R5381 Other malaise: Secondary | ICD-10-CM

## 2019-09-23 DIAGNOSIS — F17210 Nicotine dependence, cigarettes, uncomplicated: Secondary | ICD-10-CM | POA: Insufficient documentation

## 2019-09-23 DIAGNOSIS — Z79899 Other long term (current) drug therapy: Secondary | ICD-10-CM | POA: Insufficient documentation

## 2019-09-23 DIAGNOSIS — I10 Essential (primary) hypertension: Secondary | ICD-10-CM | POA: Insufficient documentation

## 2019-09-23 DIAGNOSIS — R531 Weakness: Secondary | ICD-10-CM | POA: Diagnosis present

## 2019-09-23 LAB — CBC
HCT: 41 % (ref 36.0–46.0)
Hemoglobin: 13.6 g/dL (ref 12.0–15.0)
MCH: 27.3 pg (ref 26.0–34.0)
MCHC: 33.2 g/dL (ref 30.0–36.0)
MCV: 82.3 fL (ref 80.0–100.0)
Platelets: 345 10*3/uL (ref 150–400)
RBC: 4.98 MIL/uL (ref 3.87–5.11)
RDW: 14.2 % (ref 11.5–15.5)
WBC: 8.6 10*3/uL (ref 4.0–10.5)
nRBC: 0 % (ref 0.0–0.2)

## 2019-09-23 LAB — BASIC METABOLIC PANEL
Anion gap: 10 (ref 5–15)
BUN: 19 mg/dL (ref 6–20)
CO2: 28 mmol/L (ref 22–32)
Calcium: 9.3 mg/dL (ref 8.9–10.3)
Chloride: 103 mmol/L (ref 98–111)
Creatinine, Ser: 0.68 mg/dL (ref 0.44–1.00)
GFR calc Af Amer: >60 mL/min (ref >60)
GFR calc non Af Amer: >60 mL/min (ref >60)
Glucose, Bld: 129 mg/dL — ABNORMAL HIGH (ref 70–99)
Potassium: 3.5 mmol/L (ref 3.5–5.1)
Sodium: 141 mmol/L (ref 135–145)

## 2019-09-23 LAB — TROPONIN I (HIGH SENSITIVITY)
Troponin I (High Sensitivity): 3 ng/L (ref <18)
Troponin I (High Sensitivity): 5 ng/L (ref <18)

## 2019-09-23 NOTE — Discharge Instructions (Signed)
Your workup in the Emergency Department today was reassuring.  We did not find any specific abnormalities.  We recommend you drink plenty of fluids, take your regular medications and/or any new ones prescribed today, and follow up with the doctor(s) listed in these documents as recommended.  Return to the Emergency Department if you develop new or worsening symptoms that concern you.  

## 2019-09-23 NOTE — ED Provider Notes (Addendum)
[facility] Emergency Department Provider Note  ____________________________________________   First MD Initiated Contact with Patient 09/23/19 0600     (approximate)  I have reviewed the triage vital signs and the nursing notes.   HISTORY  Chief Complaint Weakness    HPI Adrienne Adkins is a 53 y.o. female with medical history as listed below who presents for evaluation of an "episode" that happened around 2 AM while she was at work.  She reports that she felt very bad all over all of the sudden "like I had the flu".  She had some chest pressure that completely resolved after short period of time and briefly had a headache which also completely resolved.  She reports that she has been monitoring her blood pressure at home which consistently has been high but was reassuring when she came to the ED tonight.  She said that her symptoms have completely gone away and she feels much better.  She has felt a pulsating sensation in her right arm that she was not sure whether was her heartbeat or a muscle twitch, but otherwise she has had no acute abnormalities including no sore throat, shortness of breath, cough, nausea, vomiting, abdominal pain, nor dysuria.  She has had no focal weakness or numbness in her extremities and has no difficulty with ambulation.  She is currently sitting up on the side of the bed and walking without any difficulty.  No history of heart attacks, no history of blood clots in the legs nor the lungs.  No history of stroke.         Past Medical History:  Diagnosis Date  . Anxiety   . Carpal tunnel syndrome   . Dermatitis   . Diverticular disease   . Hypertension   . IGT (impaired glucose tolerance)   . Lumbago   . Migraines   . Palpitations   . Vertigo     There are no active problems to display for this patient.   Past Surgical History:  Procedure Laterality Date  . ABDOMINAL HYSTERECTOMY    . APPENDECTOMY    . COLONOSCOPY    .  INDUCED ABORTION    . OOPHORECTOMY      Prior to Admission medications   Medication Sig Start Date End Date Taking? Authorizing Provider  amLODipine (NORVASC) 2.5 MG tablet Take 2.5 mg by mouth daily. 10/11/16   [provider]  Artificial Tear Solution (20/20 ARTIFICIAL TEARS OP) Take 20 mg by mouth every other day.    [provider]  chlorpheniramine-HYDROcodone (TUSSIONEX PENNKINETIC ER) 10-8 MG/5ML SUER Take 5 mLs by mouth every 12 (twelve) hours as needed. 12/25/18   Gregor Hams, MD  ciprofloxacin (CIPRO) 500 MG tablet Take 500 mg by mouth 2 (two) times daily.    [provider]  HYDROcodone-acetaminophen (NORCO) 5-325 MG tablet Take 1 tablet by mouth every 4 (four) hours as needed for moderate pain. 11/24/17   Merlyn Lot, MD  metroNIDAZOLE (FLAGYL) 500 MG tablet Take 500 mg by mouth 3 (three) times daily.    [provider]  omeprazole (PRILOSEC) 20 MG capsule Take 20 mg by mouth daily.    [provider]  promethazine (PHENERGAN) 12.5 MG tablet Take 1 tablet (12.5 mg total) by mouth every 6 (six) hours as needed for nausea or vomiting. 11/24/17   Merlyn Lot, MD  saccharomyces boulardii (FLORASTOR) 250 MG capsule Take 250 mg by mouth daily.    [provider]  valsartan-hydrochlorothiazide (DIOVAN-HCT) [id number]  MG tablet Take 1 tablet by mouth daily.     [provider]    Allergies Beta adrenergic blockers and Amoxicillin  Family History  Problem Relation Age of Onset  . Breast cancer Maternal Aunt     Social History Social History   Tobacco Use  . Smoking status: Current Every Day Smoker    Types: Cigarettes  . Smokeless tobacco: Never Used  Substance Use Topics  . Alcohol use: Yes  . Drug use: No    Review of Systems Constitutional: No fever/chills.  Acute onset general malaise as described above, now resolved. Eyes: No visual changes. ENT: No sore throat. Cardiovascular: Chest pressure  as described above. Respiratory: Denies shortness of breath. Gastrointestinal: No abdominal pain.  No nausea, no vomiting.  No diarrhea.  No constipation. Genitourinary: Negative for dysuria. Musculoskeletal: Twitching or pulsating sensation in her right upper arm for several days.  Negative for neck pain.  Negative for back pain. Integumentary: Negative for rash. Neurological: Negative for headaches, focal weakness or numbness.   ____________________________________________   PHYSICAL EXAM:  VITAL SIGNS: ED Triage Vitals  Enc Vitals Group     BP 09/23/19 0413 124/84     Pulse Rate 09/23/19 0413 92     Resp 09/23/19 0413 20     Temp 09/23/19 0413 98.3 F (36.8 C)     Temp Source 09/23/19 0413 Oral     SpO2 09/23/19 0413 95 %     Weight 09/23/19 0414 86.2 kg (190 lb)     Height 09/23/19 0414 1.549 m (5\' 1" )     Head Circumference --      Peak Flow --      Pain Score 09/23/19 0414 0     Pain Loc --      Pain Edu? --      Excl. in GC? --     Constitutional: Alert and oriented.  Well-appearing, no acute distress, appropriately interactive.  Reports she is asymptomatic. Eyes: Conjunctivae are normal.  Head: Atraumatic. Nose: No congestion/rhinnorhea. Mouth/Throat: Patient is wearing a mask. Neck: No stridor.  No meningeal signs.   Cardiovascular: Normal rate, regular rhythm. Good peripheral circulation. Grossly normal heart sounds. Respiratory: Normal respiratory effort.  No retractions. Gastrointestinal: Soft and nontender. No distention.  Musculoskeletal: The patient is having what feels like an intermittent muscle spasm of her triceps.  Otherwise her right upper extremity is normal.  Good and normal radial pulse, no edema or evidence of acute abnormality.  No lower extremity tenderness nor edema. No gross deformities of extremities. Neurologic:  Normal speech and language. No gross focal neurologic deficits are appreciated.  Skin:  Skin is warm, dry and intact.  Psychiatric: Mood and affect are normal. Speech and behavior are normal.  ____________________________________________   LABS (all labs ordered are listed, but only abnormal results are displayed)  Labs Reviewed  BASIC METABOLIC PANEL - Abnormal; Notable for the following components:      Result Value   Glucose, Bld 129 (*)    All other components within normal limits  CBC  TROPONIN I (HIGH SENSITIVITY)  TROPONIN I (HIGH SENSITIVITY)   ____________________________________________  EKG  ED ECG REPORT I, 09/25/19, the attending physician, personally viewed and interpreted this ECG.  Date: 09/23/2019 EKG Time: 4:22 AM Rate: 74 Rhythm: normal sinus rhythm QRS Axis: normal Intervals: normal ST/T Wave abnormalities: normal Narrative Interpretation: no evidence of acute ischemia  ____________________________________________  RADIOLOGY I, 09/25/2019, personally viewed and evaluated these images (plain  radiographs) as part of my medical decision making, as well as reviewing the written report by the radiologist.  ED MD interpretation: No acute abnormalities identified on chest x-ray  Official radiology report(s): Dg Chest 2 View  Result Date: 09/23/2019 CLINICAL DATA:  Chest pain since 2 o'clock this morning. EXAM: CHEST - 2 VIEW COMPARISON:  None. FINDINGS: The cardiac silhouette, mediastinal and hilar contours are normal. The lungs are clear. No pleural effusions. No worrisome pulmonary lesions. The bony thorax is intact. IMPRESSION: No acute cardiopulmonary findings. Electronically Signed   By: Rudie MeyerP.  Gallerani M.D.   On: 09/23/2019 05:22    ____________________________________________   PROCEDURES   Procedure(s) performed (including Critical Care):  Procedures   ____________________________________________   INITIAL IMPRESSION / MDM / ASSESSMENT AND PLAN / ED COURSE  As part of my medical decision making, I reviewed the following data within the electronic  MEDICAL RECORD NUMBER Nursing notes reviewed and incorporated, Labs reviewed , EKG interpreted , Old chart reviewed, Radiograph reviewed , Notes from prior ED visits and [place] Controlled Substance Database   Differential diagnosis includes, but is not limited to, anxiety, viral illness, musculoskeletal strain, ACS, PE.  The patient is well-appearing in no distress.  Asymptomatic at this time.  Reassuring EKG and chest x-ray, normal CBC, basic metabolic panel, and initial high-sensitivity troponin.  Vital signs are all reassuring and the patient is normotensive and not tachycardic.  Well score for PE of 0, low HEAR score.  She feels fine and wants to go home.  I encouraged her to allow us to get a second high-sensitivity troponin.  If it meets criteria for minimal change, I think she is appropriate for discharge home with no evidence of an emergent medical condition at this time.  She understands and agrees with the plan.          ____________________________________________  FINAL CLINICAL IMPRESSION(S) / ED DIAGNOSES  Final diagnoses:  Malaise  Chest pressure     MEDICATIONS GIVEN DURING THIS VISIT:  Medications - No data to display   ED Discharge Orders    None      *Please note:  Luvenia Reddenonya Velez was evaluated in Emergency Department on 09/23/2019 for the symptoms described in the history of present illness. She was evaluated in the context of the global COVID-19 pandemic, which necessitated consideration that the patient might be at risk for infection with the SARS-CoV-2 virus that causes COVID-19. Institutional protocols and algorithms that pertain to the evaluation of patients at risk for COVID-19 are in a state of rapid change based on information released by regulatory bodies including the CDC and federal and state organizations. These policies and algorithms were followed during the patient's care in the ED.  Some ED evaluations and interventions may be delayed as a result of limited  staffing during the pandemic.*  Note:  This document was prepared using Dragon voice recognition software and may include unintentional dictation errors.   Loleta RoseForbach, Blondell Laperle, MD 09/23/19 [id number]    Loleta RoseForbach, Taishawn Smaldone, MD 09/23/19 [phone]

## 2019-09-23 NOTE — ED Triage Notes (Signed)
Patient to ER for c/o general malaise that she states started suddenly at approx 0200 this am while at work. Reports feeling "like I have the flu", a headache (now resolved), chest tightness (now resolved), and pulsating in right arm x2 days.

## 2019-09-23 NOTE — ED Notes (Signed)
Pt requested to leave without discharge papers or discharge instructions.

## 2019-09-23 NOTE — ED Notes (Signed)
Pt presents to ED via POV with c/o being at work earlier today when she had sudden onset general malaise, pt states "I felt like I had the flu". Pt states she had generalized chest pressure that has since resolved, HA that has since resolved. Pt states home BP cuff has been reading high (150/100). Pt is alert and oriented at this time, respirations even and unlabored, skin warm, dry, and intact. Repeat troponin collected by this RN.

## 2021-01-24 DIAGNOSIS — L0231 Cutaneous abscess of buttock: Secondary | ICD-10-CM

## 2021-01-24 HISTORY — DX: Cutaneous abscess of buttock: L02.31

## 2021-01-25 ENCOUNTER — Emergency Department
Admission: EM | Admit: 2021-01-25 | Discharge: 2021-01-25 | Disposition: A | Discharge status: Home / Self Care | Payer: BC Managed Care – PPO | Attending: Emergency Medicine | Admitting: Emergency Medicine

## 2021-01-25 ENCOUNTER — Other Ambulatory Visit: Payer: Self-pay

## 2021-01-25 ENCOUNTER — Encounter: Payer: Self-pay | Admitting: Emergency Medicine

## 2021-01-25 DIAGNOSIS — R1030 Lower abdominal pain, unspecified: Secondary | ICD-10-CM | POA: Diagnosis not present

## 2021-01-25 DIAGNOSIS — W57XXXA Bitten or stung by nonvenomous insect and other nonvenomous arthropods, initial encounter: Secondary | ICD-10-CM | POA: Diagnosis not present

## 2021-01-25 DIAGNOSIS — S30860A Insect bite (nonvenomous) of lower back and pelvis, initial encounter: Secondary | ICD-10-CM | POA: Insufficient documentation

## 2021-01-25 DIAGNOSIS — Z79899 Other long term (current) drug therapy: Secondary | ICD-10-CM | POA: Diagnosis not present

## 2021-01-25 DIAGNOSIS — F17210 Nicotine dependence, cigarettes, uncomplicated: Secondary | ICD-10-CM | POA: Diagnosis not present

## 2021-01-25 DIAGNOSIS — R3915 Urgency of urination: Secondary | ICD-10-CM | POA: Diagnosis not present

## 2021-01-25 DIAGNOSIS — I10 Essential (primary) hypertension: Secondary | ICD-10-CM | POA: Diagnosis not present

## 2021-01-25 DIAGNOSIS — R350 Frequency of micturition: Secondary | ICD-10-CM | POA: Diagnosis not present

## 2021-01-25 DIAGNOSIS — R300 Dysuria: Secondary | ICD-10-CM | POA: Diagnosis not present

## 2021-01-25 LAB — URINALYSIS, COMPLETE (UACMP) WITH MICROSCOPIC
Bacteria, UA: NONE SEEN
Bilirubin Urine: NEGATIVE
Glucose, UA: NEGATIVE mg/dL
Ketones, ur: NEGATIVE mg/dL
Leukocytes,Ua: NEGATIVE
Nitrite: NEGATIVE
Protein, ur: NEGATIVE mg/dL
Specific Gravity, Urine: 1.009 (ref 1.005–1.030)
pH: 5 (ref 5.0–8.0)

## 2021-01-25 MED ORDER — LIDOCAINE 5 % EX PTCH
1.0000 | MEDICATED_PATCH | CUTANEOUS | Status: DC
  Administered 2021-01-25: 1 via TRANSDERMAL
  Filled 2021-01-25: qty 1

## 2021-01-25 MED ORDER — LIDOCAINE-EPINEPHRINE-TETRACAINE (LET) TOPICAL GEL
3.0000 mL | Freq: Once | TOPICAL | Status: AC
  Administered 2021-01-25: 3 mL via TOPICAL
  Filled 2021-01-25: qty 3

## 2021-01-25 MED ORDER — NAPROXEN 500 MG PO TABS: 500.0000 mg | ORAL_TABLET | Freq: Two times a day (BID) | ORAL | Status: DC

## 2021-01-25 MED ORDER — TRAMADOL HCL 50 MG PO TABS: 50.0000 mg | ORAL_TABLET | Freq: Two times a day (BID) | ORAL | 0 refills | Status: DC | PRN

## 2021-01-25 MED ORDER — SULFAMETHOXAZOLE-TRIMETHOPRIM 800-160 MG PO TABS: 1.0000 | ORAL_TABLET | Freq: Two times a day (BID) | ORAL | 0 refills | Status: DC

## 2021-01-25 MED ORDER — PHENAZOPYRIDINE HCL 200 MG PO TABS: 200.0000 mg | ORAL_TABLET | Freq: Three times a day (TID) | ORAL | 0 refills | Status: DC | PRN

## 2021-01-25 MED ORDER — NAPROXEN 500 MG PO TABS
500.0000 mg | ORAL_TABLET | Freq: Once | ORAL | Status: AC
  Administered 2021-01-25: 500 mg via ORAL
  Filled 2021-01-25: qty 1

## 2021-01-25 NOTE — ED Provider Notes (Signed)
[facility] Emergency Department Provider Note   ____________________________________________   Event Date/Time   First MD Initiated Contact with Patient 01/25/21 0710     (approximate)  I have reviewed the triage vital signs and the nursing notes.   HISTORY  Chief Complaint Insect Bite    HPI Tannis Burstein is a 55 y.o. female patient believes she was bitten by a spider to the left buttocks.  Patient area is tender and red.  No drainage from site.  Patient also complained of urinary frequency and dysuria.  Patient states the frequency and dysuria associated with lower abdominal pain.  Patient denies vaginal discharge.  Patient denies fever associated with complaint.  Patient recent combine pain complaint as a 9/10.  Described pain as "achy.  No palliative measure for complaint.         Past Medical History:  Diagnosis Date  . Anxiety   . Carpal tunnel syndrome   . Dermatitis   . Diverticular disease   . Hypertension   . IGT (impaired glucose tolerance)   . Lumbago   . Migraines   . Palpitations   . Vertigo     There are no problems to display for this patient.   Past Surgical History:  Procedure Laterality Date  . ABDOMINAL HYSTERECTOMY    . APPENDECTOMY    . COLONOSCOPY    . INDUCED ABORTION    . OOPHORECTOMY      Prior to Admission medications   Medication Sig Start Date End Date Taking? Authorizing Provider  naproxen (NAPROSYN) 500 MG tablet Take 1 tablet (500 mg total) by mouth 2 (two) times daily with a meal. 01/25/21  Yes Joni Reining, PA-C  phenazopyridine (PYRIDIUM) 200 MG tablet Take 1 tablet (200 mg total) by mouth 3 (three) times daily as needed for pain. 01/25/21  Yes Joni Reining, PA-C  sulfamethoxazole-trimethoprim (BACTRIM DS) 800-160 MG tablet Take 1 tablet by mouth 2 (two) times daily. 01/25/21  Yes Joni Reining, PA-C  traMADol (ULTRAM) 50 MG tablet Take 1 tablet (50 mg total) by mouth every 12 (twelve) hours as  needed for moderate pain. 01/25/21  Yes Joni Reining, PA-C  amLODipine (NORVASC) 2.5 MG tablet Take 2.5 mg by mouth daily. 10/11/16   [provider]  Artificial Tear Solution (20/20 ARTIFICIAL TEARS OP) Take 20 mg by mouth every other day.    [provider]  chlorpheniramine-HYDROcodone (TUSSIONEX PENNKINETIC ER) 10-8 MG/5ML SUER Take 5 mLs by mouth every 12 (twelve) hours as needed. 12/25/18   Darci Current, MD  ciprofloxacin (CIPRO) 500 MG tablet Take 500 mg by mouth 2 (two) times daily.    [provider]  HYDROcodone-acetaminophen (NORCO) 5-325 MG tablet Take 1 tablet by mouth every 4 (four) hours as needed for moderate pain. 11/24/17   Willy Eddy, MD  metroNIDAZOLE (FLAGYL) 500 MG tablet Take 500 mg by mouth 3 (three) times daily.    [provider]  omeprazole (PRILOSEC) 20 MG capsule Take 20 mg by mouth daily.    [provider]  promethazine (PHENERGAN) 12.5 MG tablet Take 1 tablet (12.5 mg total) by mouth every 6 (six) hours as needed for nausea or vomiting. 11/24/17   Willy Eddy, MD  saccharomyces boulardii (FLORASTOR) 250 MG capsule Take 250 mg by mouth daily.    [provider]  valsartan-hydrochlorothiazide (DIOVAN-HCT) 320-25 MG tablet Take 1 tablet by mouth daily.     [provider]    Allergies  Beta adrenergic blockers and Amoxicillin  Family History  Problem Relation Age of Onset  . Breast cancer Maternal Aunt     Social History Social History   Tobacco Use  . Smoking status: Current Every Day Smoker    Types: Cigarettes  . Smokeless tobacco: Never Used  Vaping Use  . Vaping Use: Never used  Substance Use Topics  . Alcohol use: Yes  . Drug use: No    Review of Systems Constitutional: No fever/chills Eyes: No visual changes. ENT: No sore throat. Cardiovascular: Denies chest pain. Respiratory: Denies shortness of breath. Gastrointestinal: No abdominal pain.  No nausea, no  vomiting.  No diarrhea.  No constipation. Genitourinary: Negative for dysuria. Musculoskeletal: Negative for back pain. Skin: Negative for rash.  Mild edema and erythema left buttocks. Neurological: Negative for headaches, focal weakness or numbness. Psychiatric:  Anxiety. Endocrine:  Hypertension. Hematological/Lymphatic:  Allergic/Immunilogical: Amoxicillin beta-blockers.  ____________________________________________   PHYSICAL EXAM:  VITAL SIGNS: ED Triage Vitals  Enc Vitals Group     BP 01/25/21 0333 123/83     Pulse Rate 01/25/21 0333 79     Resp 01/25/21 0333 18     Temp 01/25/21 0333 98.4 F (36.9 C)     Temp Source 01/25/21 0333 Oral     SpO2 01/25/21 0333 98 %     Weight 01/25/21 0333 175 lb (79.4 kg)     Height 01/25/21 0333 5\' 1"  (1.549 m)     Head Circumference --      Peak Flow --      Pain Score 01/25/21 0332 9     Pain Loc --      Pain Edu? --      Excl. in GC? --     Constitutional: Alert and oriented. Well appearing and in no acute distress. Cardiovascular: Normal rate, regular rhythm. Grossly normal heart sounds.  Good peripheral circulation. Respiratory: Normal respiratory effort.  No retractions. Lungs CTAB. Gastrointestinal: Soft and nontender. No distention. No abdominal bruits. No CVA tenderness. Genitourinary: Deferred Musculoskeletal: No lower extremity tenderness nor edema.  No joint effusions. Neurologic:  Normal speech and language. No gross focal neurologic deficits are appreciated. No gait instability. Skin: Mild edema and erythema to the nonfluctuant maculopapular lesion left buttocks.   Psychiatric: Mood and affect are normal. Speech and behavior are normal.  ____________________________________________   LABS (all labs ordered are listed, but only abnormal results are displayed)  Labs Reviewed  URINALYSIS, COMPLETE (UACMP) WITH MICROSCOPIC - Abnormal; Notable for the following components:      Result Value   Color, Urine STRAW (*)     APPearance CLEAR (*)    Hgb urine dipstick SMALL (*)    All other components within normal limits   ____________________________________________  EKG   ____________________________________________  RADIOLOGY I, 03/27/21, personally viewed and evaluated these images (plain radiographs) as part of my medical decision making, as well as reviewing the written report by the radiologist.  ED MD interpretation:    Official radiology report(s): No results found.  ____________________________________________   PROCEDURES  Procedure(s) performed (including Critical Care):  Procedures   ____________________________________________   INITIAL IMPRESSION / ASSESSMENT AND PLAN / ED COURSE  As part of my medical decision making, I reviewed the following data within the electronic MEDICAL RECORD NUMBER         Patient presents with pain and edema to the left buttock secondary to insect bite.  Patient also complain urinary frequency and urgency.  Discussed lab results  with patient.  Patient complaint and physical exam is consistent with infected insect bite left buttocks and dysuria.  Discussed rationale for not incised and drained at this time since the lesion is nonfluctuant.  Patient given discharge care instruction.  Patient given prescription for Bactrim DS, naproxen, Pyridium, and tramadol.  Advised to follow-up with PCP.      ____________________________________________   FINAL CLINICAL IMPRESSION(S) / ED DIAGNOSES  Final diagnoses:  Bug bite with infection, initial encounter  Dysuria     ED Discharge Orders         Ordered    sulfamethoxazole-trimethoprim (BACTRIM DS) 800-160 MG tablet  2 times daily        01/25/21 0808    phenazopyridine (PYRIDIUM) 200 MG tablet  3 times daily PRN        01/25/21 0808    naproxen (NAPROSYN) 500 MG tablet  2 times daily with meals        01/25/21 0808    traMADol (ULTRAM) 50 MG tablet  Every 12 hours PRN        01/25/21 7412           *Please note:  Adrienne Adkins was evaluated in Emergency Department on 01/25/2021 for the symptoms described in the history of present illness. She was evaluated in the context of the global COVID-19 pandemic, which necessitated consideration that the patient might be at risk for infection with the SARS-CoV-2 virus that causes COVID-19. Institutional protocols and algorithms that pertain to the evaluation of patients at risk for COVID-19 are in a state of rapid change based on information released by regulatory bodies including the CDC and federal and state organizations. These policies and algorithms were followed during the patient's care in the ED.  Some ED evaluations and interventions may be delayed as a result of limited staffing during and the pandemic.*   Note:  This document was prepared using Dragon voice recognition software and may include unintentional dictation errors.    Joni Reining, PA-C 01/25/21 Grace Blight    Jene Every, MD 01/25/21 [phone]

## 2021-01-25 NOTE — ED Notes (Addendum)
Pt thinks she also may have a UTI (burning, frequency present). Urine sample sent.

## 2021-01-25 NOTE — Discharge Instructions (Addendum)
Follow discharge care instructions take medication as directed.  Remove Lidoderm patch in 12 hours.  Apply warm compress to area for 5 minutes twice a day starting tomorrow.

## 2021-01-25 NOTE — ED Triage Notes (Signed)
Patient ambulatory to triage with steady gait, without difficulty or distress noted; pt reports on Sunday noted ?spider bite to left buttock; area of redness noted to site

## 2021-01-31 ENCOUNTER — Other Ambulatory Visit
Admission: RE | Admit: 2021-01-31 | Discharge: 2021-01-31 | Disposition: A | Discharge status: Home / Self Care | Payer: BC Managed Care – PPO | Source: Ambulatory Visit | Attending: Surgery | Admitting: Surgery

## 2021-01-31 ENCOUNTER — Other Ambulatory Visit: Payer: Self-pay

## 2021-01-31 ENCOUNTER — Ambulatory Visit: Payer: Self-pay | Admitting: Surgery

## 2021-01-31 DIAGNOSIS — Z20822 Contact with and (suspected) exposure to covid-19: Secondary | ICD-10-CM | POA: Insufficient documentation

## 2021-01-31 DIAGNOSIS — I10 Essential (primary) hypertension: Secondary | ICD-10-CM | POA: Diagnosis not present

## 2021-01-31 DIAGNOSIS — Z01818 Encounter for other preprocedural examination: Secondary | ICD-10-CM | POA: Insufficient documentation

## 2021-01-31 NOTE — H&P (View-Only) (Signed)
Subjective:   CC: Gluteal abscess [L02.31]   HPI:  Adrienne Adkins is a 55 y.o. female who was referred by [facility] for evaluation of above. First noted 9 days ago. Bleeding started few days ago, decreasing now. Symptoms include: Pain is achy, but decreasing.  Exacerbated by pressure.  Alleviated by ultram.  Associated with nothing specific.     Past Medical History:  has a past medical history of Anxiety, Bronchitis (02/2013), Carpal tunnel syndrome, Constipation, Dermatitis, Diverticular disease, Diverticulitis (10/2017), GERD (gastroesophageal reflux disease), Hypertension, IGT (impaired glucose tolerance) (2013), Lumbago, Migraines, Obesity (BMI 30-39.9), unspecified, Palpitations, and Vertigo.  Past Surgical History:  has a past surgical history that includes Colonoscopy (12/2012); Induced Abortion By Dilation & Curettage; Appendectomy (1998); Oophorectomy; and Hysterectomy.  Family History: family history includes Asthma in her father; Breast cancer in her maternal aunt; Coronary Artery Disease (Blocked arteries around heart) in her mother; Diabetes type II in her mother; Diverticulitis in her mother; High blood pressure (Hypertension) in her mother; Lung cancer in her father; Myocardial Infarction (Heart attack) in her mother; No Known Problems in her sister and sister; Ovarian cancer in her cousin; Stroke in her mother.  Social History:  reports that she has been smoking cigarettes. She has been smoking about 0.25 packs per day. She has never used smokeless tobacco. She reports current alcohol use. She reports that she does not use drugs.  Current Medications: has a current medication list which includes the following prescription(s): amlodipine, hydrocodone-acetaminophen, naproxen, pantoprazole, and valsartan-hydrochlorothiazide.  Allergies:  Allergies  Allergen Reactions  . Amoxicillin (Bulk) Hives and Rash  . Beta-Blockers (Beta-Adrenergic Blocking Agts) Other (See Comments)     Prolonged P waves on EKG  . Sulfa (Sulfonamide Antibiotics) Rash    ROS:  A 15 point review of systems was performed and pertinent positives and negatives noted in HPI   Objective:     BP 118/80   Pulse 65   Ht 154.9 cm (5\' 1" )   Wt 76.7 kg (169 lb)   BMI 31.93 kg/m   Constitutional :  alert, appears stated age, cooperative and no distress  Lymphatics/Throat:  no asymmetry, masses, or scars  Respiratory:  clear to auscultation bilaterally  Cardiovascular:  regular rate and rhythm  Gastrointestinal: soft, non-tender; bowel sounds normal; no masses,  no organomegaly.    Musculoskeletal: Steady gait and movement  Skin: Cool and moist, chaperone present for exam.  Obvious left gluteal abscess with purulent drainage noted on overlying dressing.  Draining from punctuate lesion at apex of indurated tissue measuring approx 6cm x 7cm. Very TTP  Psychiatric: Normal affect, non-agitated, not confused       LABS:  n/a   RADS: n/a  Assessment:      Gluteal abscess [L02.31], left, recommended I&D in OR due to size and degree of tenderness noted, along with increased bleeding risk due to recent use of BC powder  Plan:     1. Gluteal abscess [L02.31] Discussed surgical excision.  Alternatives include continued observation.  Benefits include possible symptom relief, pathologic evaluation, improved cosmesis. Discussed the risk of surgery including recurrence, chronic pain, post-op infxn, poor cosmesis, poor/delayed wound healing, and possible re-operation to address said risks. The risks of general anesthetic, if used, includes MI, CVA, sudden death or even reaction to anesthetic medications also discussed.  Typical post-op recovery time of 3-5 days with possible activity restrictions were also discussed.  The patient verbalized understanding and all questions were answered to the patient's satisfaction.  2.  Patient has elected to proceed with surgical treatment. Initially recommended  proceeding today, but pt will like to take care of outside obligations prior to OR, requesting two days out and a overnight stay due to inability to find transportation.  Will arrange as such.  She stated she does not require any pain meds at this time.

## 2021-01-31 NOTE — H&P (Signed)
Subjective:   CC: Gluteal abscess [L02.31]   HPI:  Adrienne Adkins is a 54 y.o. female who was referred by [facility] for evaluation of above. First noted 9 days ago. Bleeding started few days ago, decreasing now. Symptoms include: Pain is achy, but decreasing.  Exacerbated by pressure.  Alleviated by ultram.  Associated with nothing specific.     Past Medical History:  has a past medical history of Anxiety, Bronchitis (02/2013), Carpal tunnel syndrome, Constipation, Dermatitis, Diverticular disease, Diverticulitis (10/2017), GERD (gastroesophageal reflux disease), Hypertension, IGT (impaired glucose tolerance) (2013), Lumbago, Migraines, Obesity (BMI 30-39.9), unspecified, Palpitations, and Vertigo.  Past Surgical History:  has a past surgical history that includes Colonoscopy (12/2012); Induced Abortion By Dilation & Curettage; Appendectomy (1998); Oophorectomy; and Hysterectomy.  Family History: family history includes Asthma in her father; Breast cancer in her maternal aunt; Coronary Artery Disease (Blocked arteries around heart) in her mother; Diabetes type II in her mother; Diverticulitis in her mother; High blood pressure (Hypertension) in her mother; Lung cancer in her father; Myocardial Infarction (Heart attack) in her mother; No Known Problems in her sister and sister; Ovarian cancer in her cousin; Stroke in her mother.  Social History:  reports that she has been smoking cigarettes. She has been smoking about 0.25 packs per day. She has never used smokeless tobacco. She reports current alcohol use. She reports that she does not use drugs.  Current Medications: has a current medication list which includes the following prescription(s): amlodipine, hydrocodone-acetaminophen, naproxen, pantoprazole, and valsartan-hydrochlorothiazide.  Allergies:  Allergies  Allergen Reactions  . Amoxicillin (Bulk) Hives and Rash  . Beta-Blockers (Beta-Adrenergic Blocking Agts) Other (See Comments)     Prolonged P waves on EKG  . Sulfa (Sulfonamide Antibiotics) Rash    ROS:  A 15 point review of systems was performed and pertinent positives and negatives noted in HPI   Objective:     BP 118/80   Pulse 65   Ht 154.9 cm (5' 1")   Wt 76.7 kg (169 lb)   BMI 31.93 kg/m   Constitutional :  alert, appears stated age, cooperative and no distress  Lymphatics/Throat:  no asymmetry, masses, or scars  Respiratory:  clear to auscultation bilaterally  Cardiovascular:  regular rate and rhythm  Gastrointestinal: soft, non-tender; bowel sounds normal; no masses,  no organomegaly.    Musculoskeletal: Steady gait and movement  Skin: Cool and moist, chaperone present for exam.  Obvious left gluteal abscess with purulent drainage noted on overlying dressing.  Draining from punctuate lesion at apex of indurated tissue measuring approx 6cm x 7cm. Very TTP  Psychiatric: Normal affect, non-agitated, not confused       LABS:  n/a   RADS: n/a  Assessment:      Gluteal abscess [L02.31], left, recommended I&D in OR due to size and degree of tenderness noted, along with increased bleeding risk due to recent use of BC powder  Plan:     1. Gluteal abscess [L02.31] Discussed surgical excision.  Alternatives include continued observation.  Benefits include possible symptom relief, pathologic evaluation, improved cosmesis. Discussed the risk of surgery including recurrence, chronic pain, post-op infxn, poor cosmesis, poor/delayed wound healing, and possible re-operation to address said risks. The risks of general anesthetic, if used, includes MI, CVA, sudden death or even reaction to anesthetic medications also discussed.  Typical post-op recovery time of 3-5 days with possible activity restrictions were also discussed.  The patient verbalized understanding and all questions were answered to the patient's satisfaction.  2.   Patient has elected to proceed with surgical treatment. Initially recommended  proceeding today, but pt will like to take care of outside obligations prior to OR, requesting two days out and a overnight stay due to inability to find transportation.  Will arrange as such.  She stated she does not require any pain meds at this time.

## 2021-02-01 ENCOUNTER — Other Ambulatory Visit: Payer: Self-pay

## 2021-02-01 ENCOUNTER — Other Ambulatory Visit: Payer: BC Managed Care – PPO

## 2021-02-01 ENCOUNTER — Other Ambulatory Visit
Admission: RE | Admit: 2021-02-01 | Discharge: 2021-02-01 | Disposition: A | Discharge status: Home / Self Care | Payer: BC Managed Care – PPO | Source: Ambulatory Visit | Attending: Surgery | Admitting: Surgery

## 2021-02-01 HISTORY — DX: Gastro-esophageal reflux disease without esophagitis: K21.9

## 2021-02-01 HISTORY — DX: Personal history of urinary calculi: Z87.442

## 2021-02-01 LAB — SARS CORONAVIRUS 2 (TAT 6-24 HRS): SARS Coronavirus 2: NEGATIVE

## 2021-02-01 NOTE — Patient Instructions (Addendum)
Your procedure is scheduled on: 02/02/21 Report to the Registration Desk on the 1st floor of the [facility]. To find out your arrival time, please call [phone] between 1PM - 3PM on: 02/01/21  REMEMBER: Instructions that are not followed completely may result in serious medical risk, up to and including death; or upon the discretion of your surgeon and anesthesiologist your surgery may need to be rescheduled.  Do not eat food after midnight the night before surgery.  No gum chewing, lozengers or hard candies.  You may however, drink CLEAR liquids up to 2 hours before you are scheduled to arrive for your surgery. Do not drink anything within 2 hours of your scheduled arrival time.  Clear liquids include: - water  - apple juice without pulp - gatorade (not RED, PURPLE, OR BLUE) - black coffee or tea (Do NOT add milk or creamers to the coffee or tea) Do NOT drink anything that is not on this list.  TAKE THESE MEDICATIONS THE MORNING OF SURGERY WITH A SIP OF WATER:  - amLODipine (NORVASC) 2.5 MG tablet -  Pantoprazole 40 mg 1 tablet   -One week prior to surgery: Stop Anti-inflammatories (NSAIDS) such as Advil, Aleve, Ibuprofen, Motrin, Naproxen, Naprosyn and Aspirin based products such as Excedrin, Goodys Powder, BC Powder.  Stop ANY OVER THE COUNTER supplements until after surgery.  No Alcohol for 24 hours before or after surgery.  No Smoking including e-cigarettes for 24 hours prior to surgery.  No chewable tobacco products for at least 6 hours prior to surgery.  No nicotine patches on the day of surgery.  Do not use any "recreational" drugs for at least a week prior to your surgery.  Please be advised that the combination of cocaine and anesthesia may have negative outcomes, up to and including death. If you test positive for cocaine, your surgery will be cancelled.  On the morning of surgery brush your teeth with toothpaste and water, you may rinse your mouth with  mouthwash if you wish. Do not swallow any toothpaste or mouthwash.  Do not wear jewelry, make-up, hairpins, clips or nail polish.  Do not wear lotions, powders, or perfumes.   Do not shave body from the neck down 48 hours prior to surgery just in case you cut yourself which could leave a site for infection.  Also, freshly shaved skin may become irritated if using the CHG soap.  Contact lenses, hearing aids and dentures may not be worn into surgery.  Do not bring valuables to the hospital. [facility] is not responsible for any missing/lost belongings or valuables.   Notify your doctor if there is any change in your medical condition (cold, fever, infection).  Wear comfortable clothing (specific to your surgery type) to the hospital.  Plan for stool softeners for home use; pain medications have a tendency to cause constipation. You can also help prevent constipation by eating foods high in fiber such as fruits and vegetables and drinking plenty of fluids as your diet allows.  After surgery, you can help prevent lung complications by doing breathing exercises.  Take deep breaths and cough every 1-2 hours. Your doctor may order a device called an Incentive Spirometer to help you take deep breaths. When coughing or sneezing, hold a pillow firmly against your incision with both hands. This is called "splinting." Doing this helps protect your incision. It also decreases belly discomfort.  If you are being admitted to the hospital overnight, leave your suitcase in the car. After  surgery it may be brought to your room.  If you are being discharged the day of surgery, you will not be allowed to drive home. You will need a responsible adult (18 years or older) to drive you home and stay with you that night.   If you are taking public transportation, you will need to have a responsible adult (18 years or older) with you. Please confirm with your physician that it is acceptable to use public  transportation.   Please call the Pre-admissions Testing Dept. at [phone] if you have any questions about these instructions.  Surgery Visitation Policy:  Patients undergoing a surgery or procedure may have one family member or support person with them as long as that person is not COVID-19 positive or experiencing its symptoms.  That person may remain in the waiting area during the procedure.  Inpatient Visitation:    Visiting hours are 7 a.m. to 8 p.m. Inpatients will be allowed two visitors daily. The visitors may change each day during the patient's stay. No visitors under the age of 57. Any visitor under the age of 6 must be accompanied by an adult. The visitor must pass COVID-19 screenings, use hand sanitizer when entering and exiting the patient's room and wear a mask at all times, including in the patient's room. Patients must also wear a mask when staff or their visitor are in the room. Masking is required regardless of vaccination status.

## 2021-02-02 ENCOUNTER — Encounter: Payer: Self-pay | Admitting: Surgery

## 2021-02-02 ENCOUNTER — Ambulatory Visit: Payer: BC Managed Care – PPO | Admitting: Anesthesiology

## 2021-02-02 ENCOUNTER — Encounter
Admission: RE | Disposition: A | Discharge status: Home / Self Care | Payer: Self-pay | Source: Home / Self Care | Attending: Surgery

## 2021-02-02 ENCOUNTER — Observation Stay
Admission: RE | Admit: 2021-02-02 | Discharge: 2021-02-03 | Disposition: A | Discharge status: Home / Self Care | Payer: BC Managed Care – PPO | Attending: Surgery | Admitting: Surgery

## 2021-02-02 ENCOUNTER — Other Ambulatory Visit: Payer: Self-pay

## 2021-02-02 DIAGNOSIS — E669 Obesity, unspecified: Secondary | ICD-10-CM | POA: Insufficient documentation

## 2021-02-02 DIAGNOSIS — F17210 Nicotine dependence, cigarettes, uncomplicated: Secondary | ICD-10-CM | POA: Insufficient documentation

## 2021-02-02 DIAGNOSIS — K5793 Diverticulitis of intestine, part unspecified, without perforation or abscess with bleeding: Secondary | ICD-10-CM | POA: Insufficient documentation

## 2021-02-02 DIAGNOSIS — R7302 Impaired glucose tolerance (oral): Secondary | ICD-10-CM | POA: Insufficient documentation

## 2021-02-02 DIAGNOSIS — I10 Essential (primary) hypertension: Secondary | ICD-10-CM | POA: Insufficient documentation

## 2021-02-02 DIAGNOSIS — L0231 Cutaneous abscess of buttock: Secondary | ICD-10-CM | POA: Diagnosis present

## 2021-02-02 DIAGNOSIS — F419 Anxiety disorder, unspecified: Secondary | ICD-10-CM | POA: Diagnosis not present

## 2021-02-02 DIAGNOSIS — L259 Unspecified contact dermatitis, unspecified cause: Secondary | ICD-10-CM | POA: Diagnosis not present

## 2021-02-02 DIAGNOSIS — Z79899 Other long term (current) drug therapy: Secondary | ICD-10-CM | POA: Diagnosis not present

## 2021-02-02 DIAGNOSIS — Z6831 Body mass index (BMI) 31.0-31.9, adult: Secondary | ICD-10-CM | POA: Diagnosis not present

## 2021-02-02 DIAGNOSIS — K219 Gastro-esophageal reflux disease without esophagitis: Secondary | ICD-10-CM | POA: Diagnosis not present

## 2021-02-02 HISTORY — PX: INCISION AND DRAINAGE ABSCESS: SHX5864

## 2021-02-02 LAB — CBC
HCT: 40.2 % (ref 36.0–46.0)
Hemoglobin: 13.8 g/dL (ref 12.0–15.0)
MCH: 28.8 pg (ref 26.0–34.0)
MCHC: 34.3 g/dL (ref 30.0–36.0)
MCV: 83.8 fL (ref 80.0–100.0)
Platelets: 347 10*3/uL (ref 150–400)
RBC: 4.8 MIL/uL (ref 3.87–5.11)
RDW: 13 % (ref 11.5–15.5)
WBC: 7.8 10*3/uL (ref 4.0–10.5)
nRBC: 0 % (ref 0.0–0.2)

## 2021-02-02 LAB — COMPREHENSIVE METABOLIC PANEL
ALT: 15 U/L (ref 0–44)
AST: 17 U/L (ref 15–41)
Albumin: 3.8 g/dL (ref 3.5–5.0)
Alkaline Phosphatase: 63 U/L (ref 38–126)
Anion gap: 8 (ref 5–15)
BUN: 17 mg/dL (ref 6–20)
CO2: 26 mmol/L (ref 22–32)
Calcium: 9.3 mg/dL (ref 8.9–10.3)
Chloride: 105 mmol/L (ref 98–111)
Creatinine, Ser: 0.68 mg/dL (ref 0.44–1.00)
GFR, Estimated: >60 mL/min (ref >60)
Glucose, Bld: 93 mg/dL (ref 70–99)
Potassium: 3.8 mmol/L (ref 3.5–5.1)
Sodium: 139 mmol/L (ref 135–145)
Total Bilirubin: 0.5 mg/dL (ref 0.3–1.2)
Total Protein: 7.4 g/dL (ref 6.5–8.1)

## 2021-02-02 SURGERY — INCISION AND DRAINAGE, ABSCESS
Anesthesia: General | Site: Buttocks | Laterality: Left

## 2021-02-02 MED ORDER — ENOXAPARIN SODIUM 300 MG/3ML IJ SOLN
0.5000 mg/kg | INTRAMUSCULAR | Status: DC
  Administered 2021-02-03: 40 mg via SUBCUTANEOUS
  Filled 2021-02-02: qty 0.4

## 2021-02-02 MED ORDER — SUCCINYLCHOLINE CHLORIDE 20 MG/ML IJ SOLN
INTRAMUSCULAR | Status: DC | PRN
  Administered 2021-02-02: 100 mg via INTRAVENOUS

## 2021-02-02 MED ORDER — MIDAZOLAM HCL 2 MG/2ML IJ SOLN
INTRAMUSCULAR | Status: AC
  Filled 2021-02-02: qty 2

## 2021-02-02 MED ORDER — LIDOCAINE HCL 1 % IJ SOLN
INTRAMUSCULAR | Status: DC | PRN
  Administered 2021-02-02: 2.25 mL

## 2021-02-02 MED ORDER — PROPOFOL 10 MG/ML IV BOLUS
INTRAVENOUS | Status: DC | PRN
  Administered 2021-02-02: 130 mg via INTRAVENOUS

## 2021-02-02 MED ORDER — LIDOCAINE HCL (PF) 1 % IJ SOLN
INTRAMUSCULAR | Status: AC
  Filled 2021-02-02: qty 30

## 2021-02-02 MED ORDER — HYDROCODONE-ACETAMINOPHEN 7.5-325 MG PO TABS: 1.0000 | ORAL_TABLET | Freq: Once | ORAL | Status: DC | PRN

## 2021-02-02 MED ORDER — MIDAZOLAM HCL 2 MG/2ML IJ SOLN
INTRAMUSCULAR | Status: DC | PRN
  Administered 2021-02-02: 2 mg via INTRAVENOUS

## 2021-02-02 MED ORDER — ACETAMINOPHEN 325 MG PO TABS: 325.0000 mg | ORAL_TABLET | ORAL | Status: DC | PRN

## 2021-02-02 MED ORDER — SUGAMMADEX SODIUM 200 MG/2ML IV SOLN
INTRAVENOUS | Status: DC | PRN
  Administered 2021-02-02: 200 mg via INTRAVENOUS

## 2021-02-02 MED ORDER — ONDANSETRON HCL 4 MG/2ML IJ SOLN
INTRAMUSCULAR | Status: DC | PRN
  Administered 2021-02-02: 4 mg via INTRAVENOUS

## 2021-02-02 MED ORDER — HYDROCHLOROTHIAZIDE 25 MG PO TABS
25.0000 mg | ORAL_TABLET | Freq: Every day | ORAL | Status: DC
  Administered 2021-02-02 – 2021-02-03 (×2): 25 mg via ORAL
  Filled 2021-02-02 (×2): qty 1

## 2021-02-02 MED ORDER — NAPROXEN 500 MG PO TABS
500.0000 mg | ORAL_TABLET | Freq: Two times a day (BID) | ORAL | Status: DC
  Filled 2021-02-02 (×3): qty 1

## 2021-02-02 MED ORDER — PROMETHAZINE HCL 25 MG/ML IJ SOLN: 6.2500 mg | INTRAMUSCULAR | Status: DC | PRN

## 2021-02-02 MED ORDER — ACETAMINOPHEN 160 MG/5ML PO SOLN
325.0000 mg | ORAL | Status: DC | PRN
  Filled 2021-02-02: qty 20.3

## 2021-02-02 MED ORDER — MEPERIDINE HCL 50 MG/ML IJ SOLN: 6.2500 mg | INTRAMUSCULAR | Status: DC | PRN

## 2021-02-02 MED ORDER — TRAMADOL HCL 50 MG PO TABS
50.0000 mg | ORAL_TABLET | Freq: Two times a day (BID) | ORAL | Status: DC | PRN
  Administered 2021-02-02: 50 mg via ORAL
  Filled 2021-02-02: qty 1

## 2021-02-02 MED ORDER — CHLORHEXIDINE GLUCONATE 0.12 % MT SOLN
OROMUCOSAL | Status: AC
  Administered 2021-02-02: 15 mL via OROMUCOSAL
  Filled 2021-02-02: qty 15

## 2021-02-02 MED ORDER — DEXAMETHASONE SODIUM PHOSPHATE 10 MG/ML IJ SOLN
INTRAMUSCULAR | Status: DC | PRN
  Administered 2021-02-02: 10 mg via INTRAVENOUS

## 2021-02-02 MED ORDER — ACETAMINOPHEN 325 MG PO TABS
650.0000 mg | ORAL_TABLET | Freq: Four times a day (QID) | ORAL | Status: DC | PRN
  Filled 2021-02-02: qty 2

## 2021-02-02 MED ORDER — ORAL CARE MOUTH RINSE: 15.0000 mL | Freq: Once | OROMUCOSAL | Status: AC

## 2021-02-02 MED ORDER — PANTOPRAZOLE SODIUM 40 MG PO TBEC
40.0000 mg | DELAYED_RELEASE_TABLET | Freq: Every day | ORAL | Status: DC
  Filled 2021-02-02: qty 1

## 2021-02-02 MED ORDER — DROPERIDOL 2.5 MG/ML IJ SOLN
0.6250 mg | Freq: Once | INTRAMUSCULAR | Status: DC | PRN
  Filled 2021-02-02: qty 2

## 2021-02-02 MED ORDER — FENTANYL CITRATE (PF) 100 MCG/2ML IJ SOLN: 25.0000 ug | INTRAMUSCULAR | Status: DC | PRN

## 2021-02-02 MED ORDER — ONDANSETRON 4 MG PO TBDP: 4.0000 mg | ORAL_TABLET | Freq: Four times a day (QID) | ORAL | Status: DC | PRN

## 2021-02-02 MED ORDER — VALSARTAN-HYDROCHLOROTHIAZIDE 320-25 MG PO TABS: 1.0000 | ORAL_TABLET | Freq: Every day | ORAL | Status: DC

## 2021-02-02 MED ORDER — AMLODIPINE BESYLATE 5 MG PO TABS
2.5000 mg | ORAL_TABLET | Freq: Every day | ORAL | Status: DC
  Administered 2021-02-03: 2.5 mg via ORAL
  Filled 2021-02-02: qty 0.5

## 2021-02-02 MED ORDER — CEFAZOLIN SODIUM-DEXTROSE 2-3 GM-%(50ML) IV SOLR
INTRAVENOUS | Status: DC | PRN
  Administered 2021-02-02: 2 g via INTRAVENOUS

## 2021-02-02 MED ORDER — LIDOCAINE HCL (CARDIAC) PF 100 MG/5ML IV SOSY
PREFILLED_SYRINGE | INTRAVENOUS | Status: DC | PRN
  Administered 2021-02-02: 100 mg via INTRAVENOUS

## 2021-02-02 MED ORDER — CHLORHEXIDINE GLUCONATE CLOTH 2 % EX PADS
6.0000 | MEDICATED_PAD | Freq: Once | CUTANEOUS | Status: AC
  Administered 2021-02-02: 6 via TOPICAL

## 2021-02-02 MED ORDER — LACTATED RINGERS IV SOLN: INTRAVENOUS | Status: DC

## 2021-02-02 MED ORDER — ROCURONIUM BROMIDE 100 MG/10ML IV SOLN
INTRAVENOUS | Status: DC | PRN
  Administered 2021-02-02: 25 mg via INTRAVENOUS
  Administered 2021-02-02: 10 mg via INTRAVENOUS

## 2021-02-02 MED ORDER — FENTANYL CITRATE (PF) 100 MCG/2ML IJ SOLN
INTRAMUSCULAR | Status: AC
  Filled 2021-02-02: qty 2

## 2021-02-02 MED ORDER — BUPIVACAINE-EPINEPHRINE (PF) 0.25% -1:200000 IJ SOLN
INTRAMUSCULAR | Status: AC
  Filled 2021-02-02: qty 30

## 2021-02-02 MED ORDER — CHLORHEXIDINE GLUCONATE 0.12 % MT SOLN: 15.0000 mL | Freq: Once | OROMUCOSAL | Status: AC

## 2021-02-02 MED ORDER — IRBESARTAN 150 MG PO TABS
300.0000 mg | ORAL_TABLET | Freq: Every day | ORAL | Status: DC
  Administered 2021-02-02 – 2021-02-03 (×2): 300 mg via ORAL
  Filled 2021-02-02 (×2): qty 2

## 2021-02-02 MED ORDER — ONDANSETRON HCL 4 MG/2ML IJ SOLN: 4.0000 mg | Freq: Four times a day (QID) | INTRAMUSCULAR | Status: DC | PRN

## 2021-02-02 MED ORDER — BUPIVACAINE-EPINEPHRINE 0.25% -1:200000 IJ SOLN
INTRAMUSCULAR | Status: DC | PRN
  Administered 2021-02-02: 2.25 mL

## 2021-02-02 MED ORDER — FENTANYL CITRATE (PF) 100 MCG/2ML IJ SOLN
INTRAMUSCULAR | Status: DC | PRN
  Administered 2021-02-02 (×2): 50 ug via INTRAVENOUS

## 2021-02-02 SURGICAL SUPPLY — 27 items
BLADE SURG 15 STRL LF DISP TIS (BLADE) ×1 IMPLANT
BLADE SURG 15 STRL SS (BLADE) ×1
CHLORAPREP W/TINT 26 (MISCELLANEOUS) ×2 IMPLANT
COVER WAND RF STERILE (DRAPES) ×2 IMPLANT
DRAPE LAPAROTOMY 100X77 ABD (DRAPES) ×2 IMPLANT
ELECT REM PT RETURN 9FT ADLT (ELECTROSURGICAL) ×2
ELECTRODE REM PT RTRN 9FT ADLT (ELECTROSURGICAL) ×1 IMPLANT
GAUZE PACKING IODOFORM 1/2 (PACKING) ×2 IMPLANT
GAUZE SPONGE 4X4 12PLY STRL (GAUZE/BANDAGES/DRESSINGS) ×2 IMPLANT
GLOVE SURG SYN 6.5 ES PF (GLOVE) ×4 IMPLANT
GLOVE SURG UNDER POLY LF SZ7 (GLOVE) ×2 IMPLANT
GOWN STRL REUS W/ TWL LRG LVL3 (GOWN DISPOSABLE) ×2 IMPLANT
GOWN STRL REUS W/TWL LRG LVL3 (GOWN DISPOSABLE) ×2
LABEL OR SOLS (LABEL) ×2 IMPLANT
MANIFOLD NEPTUNE II (INSTRUMENTS) ×2 IMPLANT
NEEDLE HYPO 22GX1.5 SAFETY (NEEDLE) ×2 IMPLANT
NS IRRIG 500ML POUR BTL (IV SOLUTION) ×2 IMPLANT
PACK BASIN MINOR ARMC (MISCELLANEOUS) ×2 IMPLANT
PAD ABD DERMACEA PRESS 5X9 (GAUZE/BANDAGES/DRESSINGS) ×2 IMPLANT
SUT MNCRL 4-0 (SUTURE) ×1
SUT MNCRL 4-0 27XMFL (SUTURE) ×1
SUT VIC AB 2-0 CT2 27 (SUTURE) ×2 IMPLANT
SUT VIC AB 3-0 SH 27 (SUTURE) ×1
SUT VIC AB 3-0 SH 27X BRD (SUTURE) ×1 IMPLANT
SUTURE MNCRL 4-0 27XMF (SUTURE) ×1 IMPLANT
SYR 10ML LL (SYRINGE) ×2 IMPLANT
TOWEL OR 17X26 4PK STRL BLUE (TOWEL DISPOSABLE) ×2 IMPLANT

## 2021-02-02 NOTE — Anesthesia Procedure Notes (Signed)
Procedure Name: Intubation Date/Time: 02/02/2021 12:52 PM Performed by: Nelda Marseille, CRNA Pre-anesthesia Checklist: Patient identified, Patient being monitored, Timeout performed, Emergency Drugs available and Suction available Patient Re-evaluated:Patient Re-evaluated prior to induction Oxygen Delivery Method: Circle system utilized Preoxygenation: Pre-oxygenation with 100% oxygen Induction Type: IV induction Ventilation: Mask ventilation without difficulty Laryngoscope Size: Mac, 3 and McGraph Grade View: Grade I Tube type: Oral Tube size: 7.0 mm Number of attempts: 1 Airway Equipment and Method: Stylet Placement Confirmation: ETT inserted through vocal cords under direct vision,  positive ETCO2 and breath sounds checked- equal and bilateral Secured at: 21 cm Tube secured with: Tape Dental Injury: Teeth and Oropharynx as per pre-operative assessment

## 2021-02-02 NOTE — Anesthesia Preprocedure Evaluation (Signed)
Anesthesia Evaluation  Patient identified by MRN, date of birth, ID band Patient awake    Reviewed: Allergy & Precautions, H&P , NPO status , reviewed documented beta blocker date and time   Airway Mallampati: III  TM Distance: >3 FB Neck ROM: full    Dental  (+) Teeth Intact   Pulmonary Current Smoker and Patient abstained from smoking.,    Pulmonary exam normal        Cardiovascular hypertension, Normal cardiovascular exam     Neuro/Psych  Headaches,  Neuromuscular disease    GI/Hepatic GERD  Medicated and Controlled,  Endo/Other    Renal/GU      Musculoskeletal   Abdominal   Peds  Hematology   Anesthesia Other Findings Past Medical History: No date: Carpal tunnel syndrome 06/2019: COVID-19 No date: Dermatitis No date: Diverticular disease No date: GERD (gastroesophageal reflux disease) No date: History of kidney stones No date: Hypertension No date: IGT (impaired glucose tolerance) No date: Lumbago No date: Migraines No date: Palpitations No date: Vertigo Past Surgical History: No date: ABDOMINAL HYSTERECTOMY No date: APPENDECTOMY No date: COLONOSCOPY No date: INDUCED ABORTION No date: OOPHORECTOMY BMI    Body Mass Index: 32.91 kg/m     Reproductive/Obstetrics                             Anesthesia Physical Anesthesia Plan  ASA: II  Anesthesia Plan: General   Post-op Pain Management:    Induction: Intravenous  PONV Risk Score and Plan: 3 and Ondansetron, Treatment may vary due to age or medical condition and Midazolam  Airway Management Planned: LMA  Additional Equipment:   Intra-op Plan:   Post-operative Plan: Extubation in OR  Informed Consent: I have reviewed the patients History and Physical, chart, labs and discussed the procedure including the risks, benefits and alternatives for the proposed anesthesia with the patient or authorized representative who  has indicated his/her understanding and acceptance.     Dental Advisory Given  Plan Discussed with: CRNA  Anesthesia Plan Comments: (ETT if required)        Anesthesia Quick Evaluation

## 2021-02-02 NOTE — Progress Notes (Signed)
PHARMACIST - PHYSICIAN COMMUNICATION  CONCERNING:  Enoxaparin (Lovenox) for DVT Prophylaxis    RECOMMENDATION: Patient was prescribed enoxaprin 40mg  q24 hours for VTE prophylaxis.   Filed Weights   02/02/21 1103  Weight: 79 kg (174 lb 2.6 oz)    Body mass index is 32.91 kg/m.  Estimated Creatinine Clearance: 75.6 mL/min (by C-G formula based on SCr of 0.68 mg/dL).  Based on [facility] policy patient is candidate for enoxaparin 0.5mg /kg TBW SQ every 24 hours based on BMI being >30.  DESCRIPTION: Pharmacy has adjusted enoxaparin dose per [facility] policy.  Patient is now receiving enoxaparin 40 mg every 24 hours   [facility], PharmD Pharmacy Resident  02/02/2021 4:45 PM

## 2021-02-02 NOTE — Interval H&P Note (Signed)
History and Physical Interval Note:  02/02/2021 12:01 PM  Adrienne Adkins  has presented today for surgery, with the diagnosis of Gluteal abscess  - left  L02.31.  The various methods of treatment have been discussed with the patient and family. After consideration of risks, benefits and other options for treatment, the patient has consented to  Procedure(s): INCISION AND DRAINAGE ABSCESS (Left) as a surgical intervention.  The patient's history has been reviewed, patient examined, no change in status, stable for surgery.  I have reviewed the patient's chart and labs.  Questions were answered to the patient's satisfaction.     Adrienne Adkins

## 2021-02-02 NOTE — Transfer of Care (Addendum)
Immediate Anesthesia Transfer of Care Note  Patient: Adrienne Adkins  Procedure(s) Performed: INCISION AND DRAINAGE ABSCESS (Left Buttocks)  Patient Location: PACU  Anesthesia Type:General  Level of Consciousness: awake, alert  and oriented  Airway & Oxygen Therapy: Patient Spontanous Breathing and Patient connected to face mask oxygen  Post-op Assessment: Report given to RN and Post -op Vital signs reviewed and stable  Post vital signs: Reviewed and stable  Last Vitals:  Vitals Value Taken Time  BP 124/87 02/02/21 1400  Temp 36.4 C 02/02/21 1315  Pulse 65 02/02/21 1423  Resp 13 02/02/21 1423  SpO2 95 % 02/02/21 1423  Vitals shown include unvalidated device data.  Last Pain:  Vitals:   02/02/21 1401  TempSrc:   PainSc: 0-No pain         Complications: No complications documented.

## 2021-02-02 NOTE — Op Note (Signed)
Pre-op diagnosis- left gluteal  abscess  Post-op diagnosis- left gluteal abscess  Surgeon: Tonna Boehringer  Procedure: Incision and drainage of left gluteal abscess  EBL: Minimal Specimens: None Complications: None  Indication: See HPI for further details.  Patient brought to OR and positioned on table, prepped and draped in appropriate fashion. Time out performed. Abscess palpated and area of induration noted. Injection of local preformed along line of planned incision. Incision made with 10 blade scalpel. Purulent Drainage was removed with suction throughout procedure. Loculations disrupted with hematostats and incision extended with 10 blade scalpel and bovie to extend of cavity.  Wound then irrigated, hemostasis confirmed. Abscess cavity packed with 1/2 inch iodoform strips with tail left extending out of wound. 4*4 gauze and ABD pad used to cover wound. Wound in end measured 6 cm *4 cm * 1.5 cm deep.   Patient tolerated procedure well, woken from anesthesia, and transferred to PACU in stable condition. Count was correct at end of procedure.

## 2021-02-03 ENCOUNTER — Encounter: Payer: Self-pay | Admitting: Surgery

## 2021-02-03 DIAGNOSIS — L0231 Cutaneous abscess of buttock: Secondary | ICD-10-CM | POA: Diagnosis not present

## 2021-02-03 MED ORDER — TRAMADOL HCL 50 MG PO TABS: 50.0000 mg | ORAL_TABLET | Freq: Four times a day (QID) | ORAL | 0 refills | Status: DC | PRN

## 2021-02-03 MED ORDER — DOCUSATE SODIUM 100 MG PO CAPS: 100.0000 mg | ORAL_CAPSULE | Freq: Two times a day (BID) | ORAL | 0 refills | Status: AC | PRN

## 2021-02-03 MED ORDER — ACETAMINOPHEN 325 MG PO TABS: 650.0000 mg | ORAL_TABLET | Freq: Three times a day (TID) | ORAL | 0 refills | Status: AC | PRN

## 2021-02-03 NOTE — Discharge Instructions (Signed)
I&D, Care After This sheet gives you information about how to care for yourself after your procedure. Your health care provider may also give you more specific instructions. If you have problems or questions, contact your health care provider. What can I expect after the procedure? After the procedure, it is common to have:  Soreness.  Bruising.  Itching. Follow these instructions at home: site care Follow instructions from your health care provider about how to take care of your site. Make sure you:  Wash your hands with soap and water before and after you change your bandage (dressing). If soap and water are not available, use hand sanitizer.  If the area bleeds or bruises, apply gentle pressure for 10 minutes.  OK TO SHOWER IN 24HRS AFTER REMOVING PACKING FROM INSIDE WOUND.  CLEAN AREA WITH SOAP AND WATER.  PAT DRY AND COVER WITH GAUZE DRESSING.  CHANGE GAUZE DRESSING DAILY UNTIL SEEN IN OFFICE  EXPECT SOME BLEEDING AND DISCHARGE FROM THE WOUND FOR NEXT SEVERAL DAYS AND DURING DRESSING CHANGES.  Check your site every day for signs of infection. Check for Incresing:  Redness, swelling, or pain.  Fluid or blood.  Warmth.  Pus or a bad smell.  General instructions  Rest and then return to your normal activities as told by your health care provider.   tylenol and advil as needed for discomfort.  Please alternate between the two every four hours as needed for pain.     Use narcotics, if prescribed, only when tylenol and motrin is not enough to control pain.   325-650mg  every 8hrs to max of 3000mg /24hrs (including the 325mg  in every norco dose) for the tylenol.     Advil up to 800mg  per dose every 8hrs as needed for pain.    Keep all follow-up visits as told by your health care provider. This is important. Contact a health care provider if:  You have redness, swelling, or pain around your site.  You have fluid or blood coming from your site.  Your site feels warm to  the touch.  You have pus or a bad smell coming from your site.  You have a fever.  Your sutures, skin glue, or adhesive strips loosen or come off sooner than expected. Get help right away if:  You have bleeding that does not stop with pressure or a dressing. Summary  After the procedure, it is common to have some soreness, bruising, and itching at the site.  Follow instructions from your health care provider about how to take care of your site.  Check your site every day for signs of infection.  Contact a health care provider if you have redness, swelling, or pain around your site, or your site feels warm to the touch.  Keep all follow-up visits as told by your health care provider. This is important. This information is not intended to replace advice given to you by your health care provider. Make sure you discuss any questions you have with your health care provider. Document Released: 12/09/2015 Document Revised: 05/12/2018 Document Reviewed: 05/12/2018 Elsevier Interactive Patient Education  12/11/2015.

## 2021-02-03 NOTE — Discharge Summary (Signed)
Physician Discharge Summary  Patient ID: Adrienne Adkins MRN: [id number] DOB/AGE: 1965-12-06 55 y.o.  Admit date: 02/02/2021 Discharge date: 02/03/21  Admission Diagnoses: gluteal abscess  Discharge Diagnoses:  Same as above  Discharged Condition: good  Hospital Course: admitted for above.  Stayed overnight due to transportation issues.  No issues during stay.  Consults: None  Discharge Exam: Blood pressure 116/87, pulse 62, temperature 98.5 F (36.9 C), temperature source Oral, resp. rate 18, height 5\' 1"  (1.549 m), weight 79 kg, SpO2 98 %. General appearance: alert, cooperative and no distress GI: soft, non-tender; bowel sounds normal; no masses,  no organomegaly  Gluteal wound intact dressings  Disposition:  Discharge disposition: 01-Home or Self Care       Discharge Instructions    Discharge patient   Complete by: As directed    Discharge disposition: 01-Home or Self Care   Discharge patient date: 02/03/2021     Allergies as of 02/03/2021      Reactions   Beta Adrenergic Blockers Other (See Comments)   Prolonged P waves on EKG   Sulfa Antibiotics Hives   Amoxicillin Hives, Rash      Medication List    STOP taking these medications   20/20 ARTIFICIAL TEARS OP   chlorpheniramine-HYDROcodone 10-8 MG/5ML Suer Commonly known as: Tussionex Pennkinetic ER   ciprofloxacin 500 MG tablet Commonly known as: CIPRO   HYDROcodone-acetaminophen 5-325 MG tablet Commonly known as: Norco   metroNIDAZOLE 500 MG tablet Commonly known as: FLAGYL   omeprazole 20 MG capsule Commonly known as: PRILOSEC   phenazopyridine 200 MG tablet Commonly known as: Pyridium   promethazine 12.5 MG tablet Commonly known as: PHENERGAN   saccharomyces boulardii 250 MG capsule Commonly known as: FLORASTOR     TAKE these medications   acetaminophen 325 MG tablet Commonly known as: Tylenol Take 2 tablets (650 mg total) by mouth every 8 (eight) hours as needed for mild pain.    amLODipine 2.5 MG tablet Commonly known as: NORVASC Take 2.5 mg by mouth daily.   docusate sodium 100 MG capsule Commonly known as: Colace Take 1 capsule (100 mg total) by mouth 2 (two) times daily as needed for up to 10 days for mild constipation.   naproxen 500 MG tablet Commonly known as: Naprosyn Take 1 tablet (500 mg total) by mouth 2 (two) times daily with a meal.   traMADol 50 MG tablet Commonly known as: Ultram Take 1 tablet (50 mg total) by mouth every 12 (twelve) hours as needed for moderate pain. What changed: Another medication with the same name was added. Make sure you understand how and when to take each.   traMADol 50 MG tablet Commonly known as: Ultram Take 1 tablet (50 mg total) by mouth every 6 (six) hours as needed for up to 5 doses. What changed: You were already taking a medication with the same name, and this prescription was added. Make sure you understand how and when to take each.   valsartan-hydrochlorothiazide 320-25 MG tablet Commonly known as: DIOVAN-HCT Take 1 tablet by mouth daily.       Follow-up Information    04/05/2021, DO Follow up on 02/08/2021.   Specialty: Surgery Why: post op wound check.  ok to double book Contact information: [address] [place] [id number]                Total time spent arranging discharge was >30min. Signed: 31m 02/03/2021, 11:15 AM

## 2021-02-03 NOTE — Progress Notes (Signed)
Pt discharged home.  Discharge instructions, prescriptions and follow up appointment given to and reviewed with pt.  Pt verbalized understanding.  Escorted by auxillary. 

## 2021-02-09 NOTE — Anesthesia Postprocedure Evaluation (Signed)
Anesthesia Post Note  Patient: Adrienne Adkins  Procedure(s) Performed: INCISION AND DRAINAGE ABSCESS (Left Buttocks)  Patient location during evaluation: PACU Anesthesia Type: General Level of consciousness: awake and alert Pain management: pain level controlled Vital Signs Assessment: post-procedure vital signs reviewed and stable Respiratory status: spontaneous breathing, nonlabored ventilation and respiratory function stable Cardiovascular status: blood pressure returned to baseline and stable Postop Assessment: no apparent nausea or vomiting Anesthetic complications: no   No complications documented.   Last Vitals:  Vitals:   02/03/21 0751 02/03/21 1124  BP: 116/87 109/69  Pulse: 62 78  Resp: 18 20  Temp: 36.9 C 36.8 C  SpO2: 98%     Last Pain:  Vitals:   02/03/21 1124  TempSrc: Oral  PainSc:                  Christia Reading

## 2021-07-03 ENCOUNTER — Encounter: Payer: Self-pay | Admitting: Emergency Medicine

## 2021-07-03 ENCOUNTER — Emergency Department: Payer: No Typology Code available for payment source

## 2021-07-03 ENCOUNTER — Other Ambulatory Visit: Payer: Self-pay

## 2021-07-03 ENCOUNTER — Observation Stay
Admission: EM | Admit: 2021-07-03 | Discharge: 2021-07-05 | Disposition: A | Discharge status: Home / Self Care | Payer: No Typology Code available for payment source | Attending: Internal Medicine | Admitting: Internal Medicine

## 2021-07-03 DIAGNOSIS — K922 Gastrointestinal hemorrhage, unspecified: Secondary | ICD-10-CM | POA: Diagnosis not present

## 2021-07-03 DIAGNOSIS — Z79899 Other long term (current) drug therapy: Secondary | ICD-10-CM | POA: Insufficient documentation

## 2021-07-03 DIAGNOSIS — K625 Hemorrhage of anus and rectum: Secondary | ICD-10-CM | POA: Diagnosis present

## 2021-07-03 DIAGNOSIS — D62 Acute posthemorrhagic anemia: Secondary | ICD-10-CM | POA: Diagnosis present

## 2021-07-03 DIAGNOSIS — K5792 Diverticulitis of intestine, part unspecified, without perforation or abscess without bleeding: Secondary | ICD-10-CM | POA: Diagnosis present

## 2021-07-03 DIAGNOSIS — R109 Unspecified abdominal pain: Secondary | ICD-10-CM

## 2021-07-03 DIAGNOSIS — K5781 Diverticulitis of intestine, part unspecified, with perforation and abscess with bleeding: Principal | ICD-10-CM | POA: Insufficient documentation

## 2021-07-03 DIAGNOSIS — N19 Unspecified kidney failure: Secondary | ICD-10-CM

## 2021-07-03 DIAGNOSIS — I10 Essential (primary) hypertension: Secondary | ICD-10-CM | POA: Insufficient documentation

## 2021-07-03 DIAGNOSIS — K651 Peritoneal abscess: Secondary | ICD-10-CM

## 2021-07-03 DIAGNOSIS — F17210 Nicotine dependence, cigarettes, uncomplicated: Secondary | ICD-10-CM | POA: Diagnosis not present

## 2021-07-03 DIAGNOSIS — Z20822 Contact with and (suspected) exposure to covid-19: Secondary | ICD-10-CM | POA: Diagnosis not present

## 2021-07-03 DIAGNOSIS — Z8616 Personal history of COVID-19: Secondary | ICD-10-CM | POA: Insufficient documentation

## 2021-07-03 DIAGNOSIS — K219 Gastro-esophageal reflux disease without esophagitis: Secondary | ICD-10-CM | POA: Diagnosis present

## 2021-07-03 LAB — CBC
HCT: 35 % — ABNORMAL LOW (ref 36.0–46.0)
HCT: 37 % (ref 36.0–46.0)
Hemoglobin: 12.1 g/dL (ref 12.0–15.0)
Hemoglobin: 12.7 g/dL (ref 12.0–15.0)
MCH: 29.4 pg (ref 26.0–34.0)
MCH: 29.1 pg (ref 26.0–34.0)
MCHC: 34.6 g/dL (ref 30.0–36.0)
MCHC: 34.3 g/dL (ref 30.0–36.0)
MCV: 85 fL (ref 80.0–100.0)
MCV: 84.9 fL (ref 80.0–100.0)
Platelets: 260 10*3/uL (ref 150–400)
Platelets: 258 10*3/uL (ref 150–400)
RBC: 4.12 MIL/uL (ref 3.87–5.11)
RBC: 4.36 MIL/uL (ref 3.87–5.11)
RDW: 13.1 % (ref 11.5–15.5)
RDW: 13.2 % (ref 11.5–15.5)
WBC: 7.2 10*3/uL (ref 4.0–10.5)
WBC: 7.7 10*3/uL (ref 4.0–10.5)
nRBC: 0 % (ref 0.0–0.2)
nRBC: 0 % (ref 0.0–0.2)

## 2021-07-03 LAB — HEMOGLOBIN AND HEMATOCRIT, BLOOD
HCT: 33.6 % — ABNORMAL LOW (ref 36.0–46.0)
Hemoglobin: 11.8 g/dL — ABNORMAL LOW (ref 12.0–15.0)

## 2021-07-03 LAB — TYPE AND SCREEN
ABO/RH(D): O POS
Antibody Screen: NEGATIVE

## 2021-07-03 LAB — RETICULOCYTES
Immature Retic Fract: 11 % (ref 2.3–15.9)
RBC.: 4.25 MIL/uL (ref 3.87–5.11)
Retic Count, Absolute: 68.9 10*3/uL (ref 19.0–186.0)
Retic Ct Pct: 1.6 % (ref 0.4–3.1)

## 2021-07-03 LAB — PROTIME-INR
INR: 1 (ref 0.8–1.2)
Prothrombin Time: 12.9 seconds (ref 11.4–15.2)

## 2021-07-03 LAB — COMPREHENSIVE METABOLIC PANEL
ALT: 15 U/L (ref 0–44)
AST: 20 U/L (ref 15–41)
Albumin: 3.6 g/dL (ref 3.5–5.0)
Alkaline Phosphatase: 56 U/L (ref 38–126)
Anion gap: 9 (ref 5–15)
BUN: 23 mg/dL — ABNORMAL HIGH (ref 6–20)
CO2: 27 mmol/L (ref 22–32)
Calcium: 8.7 mg/dL — ABNORMAL LOW (ref 8.9–10.3)
Chloride: 103 mmol/L (ref 98–111)
Creatinine, Ser: 0.77 mg/dL (ref 0.44–1.00)
GFR, Estimated: >60 mL/min (ref >60)
Glucose, Bld: 102 mg/dL — ABNORMAL HIGH (ref 70–99)
Potassium: 3.5 mmol/L (ref 3.5–5.1)
Sodium: 139 mmol/L (ref 135–145)
Total Bilirubin: 0.7 mg/dL (ref 0.3–1.2)
Total Protein: 7 g/dL (ref 6.5–8.1)

## 2021-07-03 LAB — FERRITIN: Ferritin: 6 ng/mL — ABNORMAL LOW (ref 11–307)

## 2021-07-03 LAB — RESP PANEL BY RT-PCR (FLU A&B, COVID) ARPGX2
Influenza A by PCR: NEGATIVE
Influenza B by PCR: NEGATIVE
SARS Coronavirus 2 by RT PCR: NEGATIVE

## 2021-07-03 LAB — IRON AND TIBC
Iron: 125 ug/dL (ref 28–170)
Saturation Ratios: 32 % — ABNORMAL HIGH (ref 10.4–31.8)
TIBC: 392 ug/dL (ref 250–450)
UIBC: 267 ug/dL

## 2021-07-03 LAB — MAGNESIUM: Magnesium: 2.3 mg/dL (ref 1.7–2.4)

## 2021-07-03 LAB — APTT: aPTT: 26 seconds (ref 24–36)

## 2021-07-03 LAB — FOLATE: Folate: 9.6 ng/mL (ref >5.9)

## 2021-07-03 MED ORDER — SODIUM CHLORIDE 0.9 % IV BOLUS
1000.0000 mL | Freq: Once | INTRAVENOUS | Status: AC
  Administered 2021-07-03: 1000 mL via INTRAVENOUS

## 2021-07-03 MED ORDER — ACETAMINOPHEN 325 MG PO TABS: 650.0000 mg | ORAL_TABLET | Freq: Four times a day (QID) | ORAL | Status: DC | PRN

## 2021-07-03 MED ORDER — IOHEXOL 350 MG/ML SOLN
100.0000 mL | Freq: Once | INTRAVENOUS | Status: AC | PRN
  Administered 2021-07-03: 100 mL via INTRAVENOUS

## 2021-07-03 MED ORDER — METRONIDAZOLE 500 MG/100ML IV SOLN
500.0000 mg | Freq: Two times a day (BID) | INTRAVENOUS | Status: DC
  Administered 2021-07-04 – 2021-07-05 (×3): 500 mg via INTRAVENOUS
  Filled 2021-07-03 (×4): qty 100

## 2021-07-03 MED ORDER — LACTATED RINGERS IV SOLN: INTRAVENOUS | Status: DC

## 2021-07-03 MED ORDER — METRONIDAZOLE 500 MG/100ML IV SOLN
500.0000 mg | Freq: Once | INTRAVENOUS | Status: AC
  Administered 2021-07-03: 500 mg via INTRAVENOUS
  Filled 2021-07-03: qty 100

## 2021-07-03 MED ORDER — PANTOPRAZOLE SODIUM 40 MG IV SOLR
40.0000 mg | INTRAVENOUS | Status: DC
  Administered 2021-07-03 – 2021-07-04 (×2): 40 mg via INTRAVENOUS
  Filled 2021-07-03 (×2): qty 40

## 2021-07-03 MED ORDER — CIPROFLOXACIN IN D5W 400 MG/200ML IV SOLN
400.0000 mg | Freq: Once | INTRAVENOUS | Status: AC
  Administered 2021-07-03: 400 mg via INTRAVENOUS
  Filled 2021-07-03: qty 200

## 2021-07-03 MED ORDER — ACETAMINOPHEN 650 MG RE SUPP: 650.0000 mg | Freq: Four times a day (QID) | RECTAL | Status: DC | PRN

## 2021-07-03 MED ORDER — ONDANSETRON HCL 4 MG/2ML IJ SOLN: 4.0000 mg | Freq: Four times a day (QID) | INTRAMUSCULAR | Status: DC | PRN

## 2021-07-03 MED ORDER — NALOXONE HCL 2 MG/2ML IJ SOSY
0.4000 mg | PREFILLED_SYRINGE | INTRAMUSCULAR | Status: DC | PRN
  Filled 2021-07-03: qty 2

## 2021-07-03 MED ORDER — CIPROFLOXACIN IN D5W 400 MG/200ML IV SOLN
400.0000 mg | Freq: Two times a day (BID) | INTRAVENOUS | Status: DC
  Administered 2021-07-04 – 2021-07-05 (×3): 400 mg via INTRAVENOUS
  Filled 2021-07-03 (×4): qty 200

## 2021-07-03 MED ORDER — FENTANYL CITRATE (PF) 100 MCG/2ML IJ SOLN: 25.0000 ug | INTRAMUSCULAR | Status: DC | PRN

## 2021-07-03 NOTE — ED Provider Notes (Signed)
CT showed possible small abscess between bladder and colon. Repeat h and h without concerning change. Did discuss abscess with patient. At this time do feel patient would benefit from IV abx and admission. Discussed with patient.    Phineas Semen, MD 07/03/21 [phone]

## 2021-07-03 NOTE — ED Triage Notes (Signed)
Pt reports that she has had bright red blood when she has bowel movements it stared last night. She reports that it is clots. She reports that she does have diverticulitis.

## 2021-07-03 NOTE — H&P (Signed)
History and Physical    PLEASE NOTE THAT DRAGON DICT[employer] SOFTWARE WAS USED IN THE CONSTRUCTION OF THIS NOTE.   Adrienne Adkins KDX:[id number] DOB: 09/13/1966 DOA: 07/03/2021  PCP: Baxter Hire, MD Patient coming from: home   I have personally briefly reviewed patient's old medical records in [place]  Chief Complaint: Bright red blood per rectum  HPI: Adrienne Adkins is a 55 y.o. female with medical history significant for hypertension, GERD, who is admitted to [facility] on 07/03/2021 with suspected acute lower gastrointestinal bleed after presenting from home to [facility] ED complaining of bright red blood per rectum.   The patient reports 2 episodes of bright red blood associated with loose stool over the last 1 day.  She reports that she is initially not experiencing any associated abdominal pain, but notes development of bilateral lower abdominal discomfort following the first of these 2 episodes of bright red blood.  Abdominal pain has been constant since onset, worsening with palpation over the bilateral abdominal quadrants, but without worsening with bowel movement itself.  Denies any associated melena, nausea, or vomiting.  No recent trauma.  Denies any personal history of malignancy and denies any recent unintentional weight loss.  He also denies any associated chest pain, shortness of breath, palpitations, dizziness, or diaphoresis.  Not associated with any subjective fever, chills, or rigors.  She notes 1 previous episode of bloody bowel movement 1 month ago, which was self-limited, with the patient experiencing no additional blood in her stool over the ensuing month until development of the aforementioned two episodes over the course of the last 24 hours.  She conveys that her most recent colonoscopy occurred 6 to 7 years ago, and was purely screening in nature, with the patient reporting no known abnormalities identified at the time of the scope.  She conveys  that subsequent to this prior colonoscopy, that she was diagnosed with diverticulosis, but denies any known prior history of diverticulitis.  She reports rare consumption of alcohol, purely on a social basis, without any recent consumption of alcohol, and denies any known history of underlying liver disease.  She reports a history of GERD, for which she notes good compliance on home Protonix.   Denies any use of blood thinners as an outpatient, including no aspirin.  She knowledges a history of osteoarthritis, for which she is prescribed naproxen, although she states that she never takes this medication or any other NSAIDs with the exception of noting that she typically takes 1 pack of BCs powder per day.  Denies any recent use of systemic corticosteroids, bisphosphonate medications, or supplemental potassium.  Per chart review, it appears that the patient's baseline hemoglobin is 13-14, with most recent prior hemoglobin noted to be 13.8 in March 2022.  Denies any recent headache, neck stiffness, rhinitis, rhinorrhea, sore throat, wheezing, cough, or rash.  No recent traveling or known COVID-19 exposures.  No recent dysuria, gross hematuria, or change in urinary urgency/frequency.     ED Course:  Vital signs in the ED were notable for the following:  - Temperature max 98.3, heart rate 60-76; blood pressure 155/92 157/96; respiratory rate 17-24, oxygen saturation 97 to 100% on room air.  Labs were notable for the following: CMP was notable for the following: Sodium 139, bicarbonate 27, BUN 23 compared to most recent prior value of 17 in March 2022, creatinine 0.77, and liver enzymes were found to be within normal limits.  Initial CBC demonstrated white blood cell count 7200, hemoglobin  12.1 with normocytic/normochromic findings and RDW 13.1, platelet count 260.  Repeat CBC performed 2 hours later in the ED demonstrated hemoglobin 12.7, with RDW 13.2, platelets 258.  INR 1.0.  PTT 26.  Screening  nasopharyngeal COVID-19 PCR was performed in the ED today and found to be negative.  CTA abdomen/pelvis showed extensive diverticulosis with evidence of diverticulitis, stranding between the colon and bladder likely secondary to aforementioned diverticulitis, while also showing a small focus of inflammatory change between the left side of the bladder dome and proximal sigmoid colon, with small fluid collection approximately 1 cm in measurement suggestive of abscess.  No evidence of bowel obstruction or perforation, and no evidence of active bleeding.  Otherwise, CTA abdomen/pelvis showed no evidence of acute intra-abdominal process.  EDP discussed the patient's case and imaging with on-call general surgery, who recommended conservative management via IV antibiotics, without need for surgical intervention.  While in the ED, the following were administered: Cipro and Flagyl in addition to 1 L normal saline bolus.  Socially, the patient was admitted to the med telemetry floor for further evaluation management of presenting suspected acute lower gastrointestinal bleed in the setting of diverticulitis as well as intra-abdominal abscess.     Review of Systems: As per HPI otherwise 10 point review of systems negative.   Past Medical History:  Diagnosis Date   Carpal tunnel syndrome    COVID-19 06/2019   Dermatitis    Diverticular disease    GERD (gastroesophageal reflux disease)    History of kidney stones    Hypertension    IGT (impaired glucose tolerance)    Lumbago    Migraines    Palpitations    Vertigo     Past Surgical History:  Procedure Laterality Date   ABDOMINAL HYSTERECTOMY     APPENDECTOMY     COLONOSCOPY     INCISION AND DRAINAGE ABSCESS Left 02/02/2021   Procedure: INCISION AND DRAINAGE ABSCESS;  Surgeon: Benjamine Sprague, DO;  Location: [facility] ORS;  Service: General;  Laterality: Left;  IODOFORM PACKING    INDUCED ABORTION     OOPHORECTOMY      Social History:  reports that  she has been smoking cigarettes. She has never used smokeless tobacco. She reports current alcohol use. She reports that she does not use drugs.   Allergies  Allergen Reactions   Beta Adrenergic Blockers Other (See Comments)    Prolonged P waves on EKG   Sulfa Antibiotics Hives   Amoxicillin Hives and Rash    Family History  Problem Relation Age of Onset   Breast cancer Maternal Aunt     Family history reviewed and not pertinent    Prior to Admission medications   Medication Sig Start Date End Date Taking? Authorizing Provider  amLODipine (NORVASC) 2.5 MG tablet Take 2.5 mg by mouth daily. 10/11/16  Yes [provider]  pantoprazole (PROTONIX) 40 MG tablet Take 40 mg by mouth daily.   Yes [provider]  valsartan-hydrochlorothiazide (DIOVAN-HCT) 320-25 MG tablet Take 1 tablet by mouth daily.    Yes [provider]  naproxen (NAPROSYN) 500 MG tablet Take 1 tablet (500 mg total) by mouth 2 (two) times daily with a meal. Patient not taking: No sig reported 01/25/21   Sable Feil, PA-C  traMADol (ULTRAM) 50 MG tablet Take 1 tablet (50 mg total) by mouth every 12 (twelve) hours as needed for moderate pain. Patient not taking: No sig reported 01/25/21   Sable Feil, PA-C  traMADol (  ULTRAM) 50 MG tablet Take 1 tablet (50 mg total) by mouth every 6 (six) hours as needed for up to 5 doses. Patient not taking: No sig reported 02/03/21   Benjamine Sprague, DO     Objective    Physical Exam: Vitals:   07/03/21 1236 07/03/21 1430 07/03/21 1630 07/03/21 1825  BP:  (!) 157/96 (!) 155/92 (!) 150/103  Pulse:  74 68 60  Resp:  (!) _0 Temp:      TempSrc:      SpO2:  100% 100% 100%  Weight: 79.4 kg     Height: 5' 1.5" (1.562 m)       General: appears to be stated age; alert, oriented Skin: warm, dry, no rash Head:  AT/[place] Mouth:  Oral mucosa membranes appear dry, normal dentition Neck: supple; trachea midline Heart:  RRR; did not appreciate any  M/R/G Lungs: CTAB, did not appreciate any wheezes, rales, or rhonchi Abdomen: + BS; soft, ND, mild tenderness to palpation in the bilateral lower abdominal quadrants in the absence of any associated guarding, rigidity, or rebound tenderness. Vascular: 2+ pedal pulses b/l; 2+ radial pulses b/l Extremities: no peripheral edema, no muscle wasting Neuro: strength and sensation intact in upper and lower extremities b/l    Labs on Admission: I have personally reviewed following labs and imaging studies  CBC: Recent Labs  Lab 07/03/21 1242 07/03/21 1500  WBC 7.2 7.7  HGB 12.1 12.7  HCT 35.0* 37.0  MCV 85.0 84.9  PLT 260 675   Basic Metabolic Panel: Recent Labs  Lab 07/03/21 1242  NA 139  K 3.5  CL 103  CO2 27  GLUCOSE 102*  BUN 23*  CREATININE 0.77  CALCIUM 8.7*   GFR: Estimated Creatinine Clearance: 76.8 mL/min (by C-G formula based on SCr of 0.77 mg/dL). Liver Function Tests: Recent Labs  Lab 07/03/21 1242  AST 20  ALT 15  ALKPHOS 56  BILITOT 0.7  PROT 7.0  ALBUMIN 3.6   No results for input(s): LIPASE, AMYLASE in the last 168 hours. No results for input(s): AMMONIA in the last 168 hours. Coagulation Profile: Recent Labs  Lab 07/03/21 1448  INR 1.0   Cardiac Enzymes: No results for input(s): CKTOTAL, CKMB, CKMBINDEX, TROPONINI in the last 168 hours. BNP (last 3 results) No results for input(s): PROBNP in the last 8760 hours. HbA1C: No results for input(s): HGBA1C in the last 72 hours. CBG: No results for input(s): GLUCAP in the last 168 hours. Lipid Profile: No results for input(s): CHOL, HDL, LDLCALC, TRIG, CHOLHDL, LDLDIRECT in the last 72 hours. Thyroid Function Tests: No results for input(s): TSH, T4TOTAL, FREET4, T3FREE, THYROIDAB in the last 72 hours. Anemia Panel: No results for input(s): VITAMINB12, FOLATE, FERRITIN, TIBC, IRON, RETICCTPCT in the last 72 hours. Urine analysis:    Component Value Date/Time   COLORURINE STRAW (A) 01/25/2021  0709   APPEARANCEUR CLEAR (A) 01/25/2021 0709   LABSPEC 1.009 01/25/2021 0709   PHURINE 5.0 01/25/2021 0709   GLUCOSEU NEGATIVE 01/25/2021 0709   HGBUR SMALL (A) 01/25/2021 0709   BILIRUBINUR NEGATIVE 01/25/2021 0709   KETONESUR NEGATIVE 01/25/2021 0709   PROTEINUR NEGATIVE 01/25/2021 0709   NITRITE NEGATIVE 01/25/2021 0709   LEUKOCYTESUR NEGATIVE 01/25/2021 0709    Radiological Exams on Admission: CT Angio Abd/Pel W and/or Wo Contrast  Result Date: 07/03/2021 CLINICAL DATA:  GI bleed evaluation.  Complains of rectal bleeding. EXAM: CTA ABDOMEN AND PELVIS WITHOUT AND WITH CONTRAST TECHNIQUE: Multidetector CT imaging of the  abdomen and pelvis was performed using the standard protocol during bolus administration of intravenous contrast. Multiplanar reconstructed images and MIPs were obtained and reviewed to evaluate the vascular anatomy. CONTRAST:  116m OMNIPAQUE IOHEXOL 350 MG/ML SOLN COMPARISON:  CT abdomen and pelvis 11/24/2017 FINDINGS: VASCULAR Aorta: Normal caliber aorta without aneurysm, dissection, vasculitis or significant stenosis. Atherosclerotic calcifications. Celiac: Patent without evidence of aneurysm, dissection, vasculitis or significant stenosis. Variant hepatic anatomy. 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 SMA: Patent without evidence of aneurysm, dissection, vasculitis or significant stenosis. Renals: Both renal arteries are patent without evidence of aneurysm, dissection, vasculitis, fibromuscular dysplasia or significant stenosis. IMA: Patent without evidence of aneurysm, dissection, vasculitis or significant stenosis. Inflow: Patent without evidence of aneurysm, dissection, vasculitis or significant stenosis. Proximal Outflow: Proximal femoral arteries are patent bilaterally. Veins: Iliac veins, IVC and renal veins are patent. Hepatic veins are patent. Main portal venous system is  patent. Review of the MIP images confirms the above findings. NON-VASCULAR Lower chest: Lung bases are clear.  No pleural effusions. Hepatobiliary: Normal appearance of the liver, gallbladder and portal venous system. No biliary dilatation. Pancreas: Unremarkable. No pancreatic ductal dilatation or surrounding inflammatory changes. Spleen: Normal in size without focal abnormality. Adrenals/Urinary Tract: Normal adrenal glands. Negative for kidney stones or hydronephrosis. 4 mm hypodensity in the medial left kidney is too small to definitively characterize. Fluid in the urinary bladder. The left side of the bladder dome appears to be tethered with a portion of the proximal sigmoid colon. Small pocket containing gas between the colon and the bladder is best seen on the coronal reformats, sequence 13, image 54. This pocket measures roughly 1.0 cm in size. No gas within the bladder lumen. Stomach/Bowel: Small hiatal hernia. Extensive colonic diverticulosis. Small focus of gas and fluid between the sigmoid colon and left side of the bladder dome on sequence 7, image 56. Minimal stranding in this area. No other areas are suggestive for bowel inflammation. There is no evidence for active GI bleeding. Normal appearance of the stomach. Negative for bowel obstruction. Lymphatic: No lymph node enlargement in the abdomen or pelvis. Reproductive: Status post hysterectomy. No adnexal masses. Other: Negative for ascites.  Negative for free air. Musculoskeletal: Degenerative facet disease in the lower lumbar spine. No acute bone abnormality. IMPRESSION: VASCULAR 1. No evidence for active GI bleeding. 2.  Aortic Atherosclerosis (ICD10-I70.0). 3. Main visceral arteries are widely patent. Variant hepatic anatomy as described. 4. Portal venous system is patent. NON-VASCULAR 1. Small focus of inflammatory change between the left side of the bladder dome and the proximal sigmoid colon. There is a small collection in this area, measuring  roughly 1 cm, that could represent a small abscess. There is no definite gas within the urinary bladder lumen. Minimal stranding around this inflammatory pocket between the colon and bladder. Findings are likely secondary to diverticulitis. Electronically Signed   By: AMarkus DaftM.D.   On: 07/03/2021 16:06       Assessment/Plan   TJessey Huyettis a 55y.o. female with medical history significant for hypertension, GERD, [facility] acute lower gastrointestinal bleed [facility] bright red blood per rect[facility].    Principal Problem:   Acute lower GI bleeding Active Problems:   Acute blood loss anemia   Abdominal pain   Intra-abdominal abscess (HCC)  Diverticulitis   Acute prerenal azotemia   GERD (gastroesophageal reflux disease)   Hypertension     #) Acute lower GI bleed: diagnosis on the basis of 2 episodes of bright red blood per rectum over the last day, with associated presenting acute anemia consistent with acute blood loss.  In the setting of the bright red appearance as well as the apparent presence of hemodynamic stability and spite of onset of the above episodes 24 hours ago, presentation appears most consistent with a lower gastrointestinal source.  BUN is slightly higher than that observed in March '22, although this interval elevation is minimal and is felt to be consistent with prerenal azotemia as a consequence of dehydration given loose stool over the last day as well as associated decline in oral intake over that time, although it is acknowledged that the patient is at increased risk for development of an acute upper GI bleed given her daily use of BCs powder.  Patient suspected acute lower gastrointestinal bleed appears diverticular in nature, with CT abdomen/pelvis showing evidence of extensive diverticulosis with also showing evidence of diverticulitis, as further  detailed above.  Aside from the episodes of bright red blood per rectum and lower abdominal discomfort, the patient appears otherwise asymptomatic, denies any associated chest pain, shortness of breath, palpitations, dizziness, presyncope, or syncope.  Not on any blood thinners as an outpatient, including no aspirin. No reported history of regular/recent alcohol abuse. No known history of liver disease to warrant SBP prophylaxis. Presentation also appears to be associated with mild acute blood loss anemia, as further described below. Presenting INR noted to be 1.0. Given suspected lower GI source, there does not appear to be an indication for initiation of Protonix drip.   In the setting of suspected stable appearing acute lower gastrointestinal bleed of suspected diverticular source, with concomitant acute diverticulitis serving as a prohibitive element from colonoscopy at this time, I have elected to not consult GI at this time. However, will closely monitor clinical trend of her hospital course as well as close trending of serial hemoglobin level and monitoring of hemodynamic stability.  As the patient reports that she has not previously been diagnosed with diverticulitis, will likely ultimately warrant outpatient colonoscopy following resolution of acute diverticulitis      Plan: NPO. Refraining from pharmacologic DVT prophylaxis. Monitor on telemetry. Monitor continuous pulse-ox. Maintain at least 2 large bore IV's. Check INR in the AM. Q4H H&H's have been ordered through 9 AM on 07/04/21. Type and screen has been ordered.  Repeat CBC in the morning.  Repeat CMP in the morning, including close attention to interval trend in BUN.  Gentle continuous IV fluids via lactated Ringer's at 50 cc/h.  Counseled the patient on the importance of reduction in use of BCs powder as an outpatient, with goal of complete discontinuation of this outpatient medication.  Further evaluation management of acute diverticulitis,  as above.      #) Acute diverticulitis: Acute lower abdominal discomfort, with CT abdomen/pelvis performed today demonstrating evidence of diverticulitis, as further described above.  While no associated evidence of bowel perforation on CT scan, there is evidence to suggest abscess, as further detailed below.  Does not meet SIRS criteria to be considered septic at this time.  Cipro and Flagyl initiated in the ED.   Plan: Continue Cipro and Flagyl.  2 blood cultures x2.  As needed IV fentanyl.  Repeat CBC and CMP in the morning. Will likely ultimately warranted outpatient colonoscopy following resolution of  acute diverticulitis given the patient's report that acute diverticulitis is a new diagnosis for her.  NPO.       #) Intra-abdominal abscess: Today CT abdomen/pelvis demonstrated evidence suggestive of a small, approximately 1 cm abscess between the left side of the bladder dome and the proximal sigmoid colon, in the setting of evidence of acute diverticulitis without overt evidence of bowel perforation. patient's case and imaging were discussed with the on-call general surgery, who recommended conservative management via IV antibiotics, without need for surgical intervention.  Of note, surgery.  Not met for the patient to be considered septic at this time.  Appears hemodynamically stable.  Plan: Continue Cipro and Flagyl, which were initiated in the ED today.  Her blood cultures x2..  IV fentanyl.  Repeat CMP and CBC in the morning.  SCDs.      #) Acute blood loss anemia: in the setting of suspected acute lower GI bleed, as above, presenting Hgb noted to be 12.1, which is associated with a nearly two-point drop relative to most recent prior Hgb value of 13.8 in March 2022. At this time, patient appears hemodynamically stable and asymptomatic, as further described above.    Plan: work-up and management for presenting suspected acute lower GI bleed, as above, including close monitoring of  Q4H H&H's, with clinical evaluation for determination of need for blood transfusion, as further described above. Monitor on telemetry. Monitor continuous pulse-ox. NPO. Refraining from pharmacologic DVT prophylaxis. Check INR in the morning. Add on the following to initial lab specimen collected in the ED today: total iron, TIBC, ferritin, MMA, folic acid level, reticulocyte count.       #) Essential hypertension: Documented history of such, with outpatient antihypertensive regimen consisting of valsartan, Norvasc, and HCTZ.  Of note, not on a beta-blocker at home, which appears relevant is in the context of presenting acute lower gastrointestinal bleed.  Systolic blood pressures have been in the 150s thus far, without any evidence of hypotension.  In the context of presenting acute lower gastrointestinal bleed with associated acute blood loss anemia, will hold home antihypertensive medications for now.  Plan: Hold home valsartan, Norvasc, and HCTZ for now, as above.  Close monitoring of ensuing blood pressure via routine vital signs.       #) GERD: Documented history of such, with the patient reporting good compliance with home Protonix.  Plan: In the setting of current n.p.o. status, will temporarily transition to oral Protonix to IV form on a daily basis.       #) Chronic tobacco abuse: The patient acknowledges that she is a current smoker.   Plan: Counseled the patient on the importance of complete smoking discontinuation.      DVT prophylaxis: SCDs Code Status: Full code Family Communication: none Disposition Plan: Per Rounding Team Consults called: case/imaging discussed with on-call general surgery, as further detailed above;  Admission status: Inpatient; med-tele     Of note, this patient was added by me to the following Admit List/Treatment Team: [facility].      PLEASE NOTE THAT DRAGON DICTATION SOFTWARE WAS USED IN THE CONSTRUCTION OF THIS NOTE.   [place] [facility] Pager [phone] From [place]  Otherwise, please contact night-coverage  [email or web address] Password [facility]   07/03/2021, 6:43 PM

## 2021-07-03 NOTE — ED Provider Notes (Signed)
[facility] Emergency Department Provider Note  ____________________________________________  Time seen: Approximately 2:49 PM  I have reviewed the triage vital signs and the nursing notes.   HISTORY  Chief Complaint Rectal Bleeding    HPI Adrienne Adkins is a 55 y.o. female with a history of diverticulosis hypertension GERD who comes the ED complaining of rectal bleeding since last night.  She has had 3 episodes of passing bright red blood without stool.  Also some blood clots.  She has some right lower quadrant abdominal pain, no fever or chills or vomiting.  Denies history of IBD or ulcerative colitis or autoimmune disorder.  Does not take blood thinners.  No recent illness or antibiotic use.  She does endorse some dizziness this morning.    Past Medical History:  Diagnosis Date   Carpal tunnel syndrome    COVID-19 06/2019   Dermatitis    Diverticular disease    GERD (gastroesophageal reflux disease)    History of kidney stones    Hypertension    IGT (impaired glucose tolerance)    Lumbago    Migraines    Palpitations    Vertigo      Patient Active Problem List   Diagnosis Date Noted   Gluteal abscess 02/02/2021     Past Surgical History:  Procedure Laterality Date   ABDOMINAL HYSTERECTOMY     APPENDECTOMY     COLONOSCOPY     INCISION AND DRAINAGE ABSCESS Left 02/02/2021   Procedure: INCISION AND DRAINAGE ABSCESS;  Surgeon: Sung Amabile, DO;  Location: [facility] ORS;  Service: General;  Laterality: Left;  IODOFORM PACKING    INDUCED ABORTION     OOPHORECTOMY       Prior to Admission medications   Medication Sig Start Date End Date Taking? Authorizing Provider  amLODipine (NORVASC) 2.5 MG tablet Take 2.5 mg by mouth daily. 10/11/16   [provider]  naproxen (NAPROSYN) 500 MG tablet Take 1 tablet (500 mg total) by mouth 2 (two) times daily with a meal. 01/25/21   Joni Reining, PA-C  traMADol (ULTRAM) 50 MG tablet Take 1 tablet  (50 mg total) by mouth every 12 (twelve) hours as needed for moderate pain. 01/25/21   Joni Reining, PA-C  traMADol (ULTRAM) 50 MG tablet Take 1 tablet (50 mg total) by mouth every 6 (six) hours as needed for up to 5 doses. 02/03/21   Tonna Boehringer, Isami, DO  valsartan-hydrochlorothiazide (DIOVAN-HCT) 320-25 MG tablet Take 1 tablet by mouth daily.     [provider]     Allergies Beta adrenergic blockers, Sulfa antibiotics, and Amoxicillin   Family History  Problem Relation Age of Onset   Breast cancer Maternal Aunt     Social History Social History   Tobacco Use   Smoking status: Every Day    Types: Cigarettes   Smokeless tobacco: Never  Vaping Use   Vaping Use: Never used  Substance Use Topics   Alcohol use: Yes    Comment: social   Drug use: No    Review of Systems  Constitutional:   No fever or chills.  ENT:   No sore throat. No rhinorrhea. Cardiovascular:   No chest pain or syncope. Respiratory:   No dyspnea or cough. Gastrointestinal:   Positive as above for abdominal pain and rectal bleeding.  Musculoskeletal:   Negative for focal pain or swelling All other systems reviewed and are negative except as documented above in ROS and HPI.  ____________________________________________   PHYSICAL EXAM:  VITAL SIGNS: ED Triage Vitals  Enc Vitals Group     BP 07/03/21 1235 (!) 152/107     Pulse Rate 07/03/21 1235 76     Resp 07/03/21 1235 20     Temp 07/03/21 1235 98.3 F (36.8 C)     Temp Source 07/03/21 1235 Oral     SpO2 07/03/21 1235 97 %     Weight 07/03/21 1236 175 lb (79.4 kg)     Height 07/03/21 1236 5' 1.5" (1.562 m)     Head Circumference --      Peak Flow --      Pain Score 07/03/21 1236 0     Pain Loc --      Pain Edu? --      Excl. in GC? --     Vital signs reviewed, nursing assessments reviewed.   Constitutional:   Alert and oriented. Non-toxic appearance. Eyes:   Conjunctivae are normal. EOMI. PERRL. ENT      Head:   Normocephalic  and atraumatic.      Nose:   Normal .      Mouth/Throat:   Normal.      Neck:   No meningismus. Full ROM. Hematological/Lymphatic/Immunilogical:   No cervical lymphadenopathy. Cardiovascular:   RRR. Symmetric bilateral radial and DP pulses.  No murmurs. Cap refill less than 2 seconds. Respiratory:   Normal respiratory effort without tachypnea/retractions. Breath sounds are clear and equal bilaterally. No wheezes/rales/rhonchi. Gastrointestinal:   Soft with right lower quadrant tenderness. Non distended. There is no CVA tenderness.  No rebound, rigidity, or guarding.  Rectal exam performed with nurse Lequita Halt at bedside, no external hemorrhoids.  There is fresh red blood in the rectum. Musculoskeletal:   Normal range of motion in all extremities. No joint effusions.  No lower extremity tenderness.  No edema. Neurologic:   Normal speech and language.  Motor grossly intact. No acute focal neurologic deficits are appreciated.  Skin:    Skin is warm, dry and intact. No rash noted.  No petechiae, purpura, or bullae.  ____________________________________________    LABS (pertinent positives/negatives) (all labs ordered are listed, but only abnormal results are displayed) Labs Reviewed  COMPREHENSIVE METABOLIC PANEL - Abnormal; Notable for the following components:      Result Value   Glucose, Bld 102 (*)    BUN 23 (*)    Calcium 8.7 (*)    All other components within normal limits  CBC - Abnormal; Notable for the following components:   HCT 35.0 (*)    All other components within normal limits  PROTIME-INR  APTT  CBC  POC OCCULT BLOOD, ED   ____________________________________________   EKG    ____________________________________________    RADIOLOGY  No results found.  ____________________________________________   PROCEDURES Procedures  ____________________________________________  DIFFERENTIAL DIAGNOSIS   IBD, diverticulitis/colitis, diverticular bleed, AVM,  cancer  CLINICAL IMPRESSION / ASSESSMENT AND PLAN / ED COURSE  Medications ordered in the ED: Medications  sodium chloride 0.9 % bolus 1,000 mL (has no administration in time range)    Pertinent labs & imaging results that were available during my care of the patient were reviewed by me and considered in my medical decision making (see chart for details).  Meeyah Ovitt was evaluated in Emergency Department on 07/03/2021 for the symptoms described in the history of present illness. She was evaluated in the context of the global COVID-19 pandemic, which necessitated consideration that the patient might be at risk for infection with the SARS-CoV-2 virus that  causes COVID-19. Institutional protocols and algorithms that pertain to the evaluation of patients at risk for COVID-19 are in a state of rapid change based on information released by regulatory bodies including the CDC and federal and state organizations. These policies and algorithms were followed during the patient's care in the ED.   Patient presents with BRBPR, 3 episodes of passing blood without stool since last night.  Vital signs are normal, initial hemoglobin is 12.  No visible hemorrhoids on exam.  Will obtain CT angiogram of the abdomen, recheck CBC for trending hemoglobin and reassess.    Discussing the possibility of admission with the patient, she prefers to wait for results and then discuss the plan of care.  Care will be signed out to oncoming physician at 3:00 PM.      ____________________________________________   FINAL CLINICAL IMPRESSION(S) / ED DIAGNOSES    Final diagnoses:  Lower GI bleed     ED Discharge Orders     None       Portions of this note were generated with dragon dictation software. Dictation errors may occur despite best attempts at proofreading.    Sharman Cheek, MD 07/03/21 [phone]

## 2021-07-04 DIAGNOSIS — K651 Peritoneal abscess: Secondary | ICD-10-CM | POA: Diagnosis present

## 2021-07-04 DIAGNOSIS — D62 Acute posthemorrhagic anemia: Secondary | ICD-10-CM | POA: Diagnosis present

## 2021-07-04 DIAGNOSIS — I10 Essential (primary) hypertension: Secondary | ICD-10-CM | POA: Diagnosis present

## 2021-07-04 DIAGNOSIS — K922 Gastrointestinal hemorrhage, unspecified: Secondary | ICD-10-CM | POA: Diagnosis not present

## 2021-07-04 DIAGNOSIS — R109 Unspecified abdominal pain: Secondary | ICD-10-CM | POA: Diagnosis present

## 2021-07-04 DIAGNOSIS — K219 Gastro-esophageal reflux disease without esophagitis: Secondary | ICD-10-CM | POA: Diagnosis present

## 2021-07-04 DIAGNOSIS — N19 Unspecified kidney failure: Secondary | ICD-10-CM | POA: Diagnosis present

## 2021-07-04 DIAGNOSIS — K5792 Diverticulitis of intestine, part unspecified, without perforation or abscess without bleeding: Secondary | ICD-10-CM | POA: Diagnosis present

## 2021-07-04 LAB — URINALYSIS, ROUTINE W REFLEX MICROSCOPIC
Bacteria, UA: NONE SEEN
Bilirubin Urine: NEGATIVE
Glucose, UA: NEGATIVE mg/dL
Hgb urine dipstick: NEGATIVE
Ketones, ur: NEGATIVE mg/dL
Nitrite: NEGATIVE
Protein, ur: NEGATIVE mg/dL
Specific Gravity, Urine: 1.016 (ref 1.005–1.030)
pH: 7 (ref 5.0–8.0)

## 2021-07-04 LAB — COMPREHENSIVE METABOLIC PANEL
ALT: 13 U/L (ref 0–44)
AST: 18 U/L (ref 15–41)
Albumin: 3.2 g/dL — ABNORMAL LOW (ref 3.5–5.0)
Alkaline Phosphatase: 50 U/L (ref 38–126)
Anion gap: 6 (ref 5–15)
BUN: 13 mg/dL (ref 6–20)
CO2: 28 mmol/L (ref 22–32)
Calcium: 8.5 mg/dL — ABNORMAL LOW (ref 8.9–10.3)
Chloride: 106 mmol/L (ref 98–111)
Creatinine, Ser: 0.56 mg/dL (ref 0.44–1.00)
GFR, Estimated: >60 mL/min (ref >60)
Glucose, Bld: 93 mg/dL (ref 70–99)
Potassium: 3.3 mmol/L — ABNORMAL LOW (ref 3.5–5.1)
Sodium: 140 mmol/L (ref 135–145)
Total Bilirubin: 0.7 mg/dL (ref 0.3–1.2)
Total Protein: 6.3 g/dL — ABNORMAL LOW (ref 6.5–8.1)

## 2021-07-04 LAB — CBC
HCT: 33.5 % — ABNORMAL LOW (ref 36.0–46.0)
Hemoglobin: 11.4 g/dL — ABNORMAL LOW (ref 12.0–15.0)
MCH: 28.6 pg (ref 26.0–34.0)
MCHC: 34 g/dL (ref 30.0–36.0)
MCV: 84.2 fL (ref 80.0–100.0)
Platelets: 229 10*3/uL (ref 150–400)
RBC: 3.98 MIL/uL (ref 3.87–5.11)
RDW: 13.1 % (ref 11.5–15.5)
WBC: 5.4 10*3/uL (ref 4.0–10.5)
nRBC: 0 % (ref 0.0–0.2)

## 2021-07-04 LAB — PROTIME-INR
INR: 1 (ref 0.8–1.2)
Prothrombin Time: 13.3 seconds (ref 11.4–15.2)

## 2021-07-04 LAB — HIV ANTIBODY (ROUTINE TESTING W REFLEX): HIV Screen 4th Generation wRfx: NONREACTIVE

## 2021-07-04 LAB — MAGNESIUM: Magnesium: 2.1 mg/dL (ref 1.7–2.4)

## 2021-07-04 LAB — HEMOGLOBIN AND HEMATOCRIT, BLOOD
HCT: 33.9 % — ABNORMAL LOW (ref 36.0–46.0)
Hemoglobin: 11.7 g/dL — ABNORMAL LOW (ref 12.0–15.0)

## 2021-07-04 MED ORDER — POTASSIUM CHLORIDE CRYS ER 20 MEQ PO TBCR
40.0000 meq | EXTENDED_RELEASE_TABLET | Freq: Once | ORAL | Status: AC
  Administered 2021-07-04: 40 meq via ORAL
  Filled 2021-07-04: qty 2

## 2021-07-04 MED ORDER — IRBESARTAN 150 MG PO TABS
300.0000 mg | ORAL_TABLET | Freq: Every day | ORAL | Status: DC
  Administered 2021-07-04 – 2021-07-05 (×2): 300 mg via ORAL
  Filled 2021-07-04 (×2): qty 2

## 2021-07-04 MED ORDER — VALSARTAN-HYDROCHLOROTHIAZIDE 320-25 MG PO TABS: 1.0000 | ORAL_TABLET | Freq: Every day | ORAL | Status: DC

## 2021-07-04 MED ORDER — TRAMADOL HCL 50 MG PO TABS
50.0000 mg | ORAL_TABLET | Freq: Four times a day (QID) | ORAL | Status: DC | PRN
  Administered 2021-07-04: 50 mg via ORAL
  Filled 2021-07-04: qty 1

## 2021-07-04 MED ORDER — AMLODIPINE BESYLATE 5 MG PO TABS
2.5000 mg | ORAL_TABLET | Freq: Every day | ORAL | Status: DC
  Administered 2021-07-04 – 2021-07-05 (×2): 2.5 mg via ORAL
  Filled 2021-07-04 (×2): qty 1

## 2021-07-04 MED ORDER — HYDROCHLOROTHIAZIDE 25 MG PO TABS
25.0000 mg | ORAL_TABLET | Freq: Every day | ORAL | Status: DC
  Administered 2021-07-04 – 2021-07-05 (×2): 25 mg via ORAL
  Filled 2021-07-04 (×2): qty 1

## 2021-07-04 NOTE — Plan of Care (Signed)
Patient alert and oriented x 4, verbalizes that abdominal pain has improved since admission, did not require prn pain medications. No bloody bowel movements during shift, voiding with no issues. Hemodynamically stable per labs. Antibiotic therapy initiated with no adverse reactions. Vitals stable, no respiratory distress on room air. Stable condition at end of shift, will continue to monitor.  Problem: Education: Goal: Knowledge of General Education information will improve Description: Including pain rating scale, medication(s)/side effects and non-pharmacologic comfort measures 07/04/2021 0751 by Shelly Bombard, RN Outcome: Progressing 07/04/2021 0223 by Shelly Bombard, RN Outcome: Progressing   Problem: Health Behavior/Discharge Planning: Goal: Ability to manage health-related needs will improve 07/04/2021 0751 by Shelly Bombard, RN Outcome: Progressing 07/04/2021 0223 by Shelly Bombard, RN Outcome: Progressing   Problem: Clinical Measurements: Goal: Ability to maintain clinical measurements within normal limits will improve 07/04/2021 0751 by Shelly Bombard, RN Outcome: Progressing 07/04/2021 0223 by Shelly Bombard, RN Outcome: Progressing Goal: Will remain free from infection 07/04/2021 0751 by Shelly Bombard, RN Outcome: Progressing 07/04/2021 0223 by Shelly Bombard, RN Outcome: Progressing Goal: Diagnostic test results will improve 07/04/2021 0751 by Shelly Bombard, RN Outcome: Progressing 07/04/2021 0223 by Shelly Bombard, RN Outcome: Progressing Goal: Respiratory complications will improve 07/04/2021 0751 by Shelly Bombard, RN Outcome: Progressing 07/04/2021 0223 by Shelly Bombard, RN Outcome: Progressing Goal: Cardiovascular complication will be avoided 07/04/2021 0751 by Shelly Bombard, RN Outcome: Progressing 07/04/2021 0223 by Shelly Bombard, RN Outcome: Progressing   Problem: Activity: Goal: Risk for activity intolerance will decrease 07/04/2021 0751 by Shelly Bombard, RN Outcome:  Progressing 07/04/2021 0223 by Shelly Bombard, RN Outcome: Progressing   Problem: Nutrition: Goal: Adequate nutrition will be maintained 07/04/2021 0751 by Shelly Bombard, RN Outcome: Progressing 07/04/2021 0223 by Shelly Bombard, RN Outcome: Progressing   Problem: Coping: Goal: Level of anxiety will decrease 07/04/2021 0751 by Shelly Bombard, RN Outcome: Progressing 07/04/2021 0223 by Shelly Bombard, RN Outcome: Progressing   Problem: Safety: Goal: Ability to remain free from injury will improve 07/04/2021 0751 by Shelly Bombard, RN Outcome: Progressing 07/04/2021 0223 by Shelly Bombard, RN Outcome: Progressing

## 2021-07-04 NOTE — Progress Notes (Signed)
PROGRESS NOTE    Adrienne Adkins  EXB:[id number] DOB: 1966/07/31 DOA: 07/03/2021 PCP: Gracelyn Nurse, MD  [id number]   Assessment & Plan:   Principal Problem:   Acute lower GI bleeding Active Problems:   Acute blood loss anemia   Abdominal pain   Intra-abdominal abscess (HCC)   Diverticulitis   Acute prerenal azotemia   GERD (gastroesophageal reflux disease)   Hypertension   Adrienne Adkins is a 55 y.o. female with medical history significant for hypertension, GERD, who is admitted to [facility] on 07/03/2021 with suspected acute lower gastrointestinal bleed after presenting from home to [facility] ED complaining of bright red blood per rectum.    #) Acute lower GI bleed 2/2 Diverticulitis --2 episodes of bright red blood per rectum over the last day.   --Hgb stable at 11's --No more bloody stool since presentation. --CT abdomen/pelvis showing evidence of extensive diverticulosis with also showing evidence of diverticulitis Plan: --advance to regular diet --No need for inpatient GI consult --monitor Hgb, and discharge tomorrow if stable and no more bleeding  #) Acute diverticulitis with small abscess --no fever, no leukocytosis. --Cipro and Flagyl initiated in the ED. Plan: --cont IV Cipro and Flagyl --can transition to oral at discharge to finish a 10 day course.   #) Acute blood loss anemia, ruled out --Hgb 12.1 on presentation, which is wnl.  Subsequent minor drop is likely due to hospital blood draws.    #) Essential hypertension:  --Home BP meds held on admission.  BP has been elevated. Plan: --resume home amlodipine and Diovan    #) GERD:  --d/c IV PPI and resume oral PPI    #) Chronic tobacco abuse current smoker   Hypokalemia --replete with oral potassium    DVT prophylaxis: SCD/Compression stockings Code Status: Full code  Family Communication:  Level of care: Med-Surg Dispo:   The patient is from: home Anticipated d/c is to:  home Anticipated d/c date is: likely tomorrow Patient currently is not medically ready to d/c due to: monitor for GI bleed    Subjective and Interval History:  No abdominal pain.  No more bloody stool.  Normal urination.  Eager to eat regular food.   Objective: Vitals:   07/04/21 1159 07/04/21 1540 07/04/21 2000 07/04/21 2324  BP: (!) 155/94 (!) 149/92 (!) 144/99 113/81  Pulse: 62 78 77 67  Resp: 16 16 17 17   Temp: 98.7 F (37.1 C) 99 F (37.2 C) 98.2 F (36.8 C) 97.8 F (36.6 C)  TempSrc:      SpO2: 99% 100% 99% 100%  Weight:      Height:        Intake/Output Summary (Last 24 hours) at 07/05/2021 0037 Last data filed at 07/04/2021 1011 Gross per 24 hour  Intake 277.7 ml  Output --  Net 277.7 ml   Filed Weights   07/03/21 1236 07/04/21 0500  Weight: 79.4 kg 82.9 kg    Examination:   Constitutional: NAD, AAOx3, sitting at side of bed HEENT: conjunctivae and lids normal, EOMI CV: No cyanosis.   RESP: normal respiratory effort, on RA Extremities: No effusions, edema in BLE SKIN: warm, dry Neuro: II - XII grossly intact.   Psych: Normal mood and affect.  Appropriate judgement and reason   Data Reviewed: I have personally reviewed following labs and imaging studies  CBC: Recent Labs  Lab 07/03/21 1242 07/03/21 1500 07/03/21 2236 07/04/21 0349 07/04/21 0907  WBC 7.2 7.7  --  5.4  --  HGB 12.1 12.7 11.8* 11.4* 11.7*  HCT 35.0* 37.0 33.6* 33.5* 33.9*  MCV 85.0 84.9  --  84.2  --   PLT 260 258  --  229  --    Basic Metabolic Panel: Recent Labs  Lab 07/03/21 1242 07/04/21 0349  NA 139 140  K 3.5 3.3*  CL 103 106  CO2 27 28  GLUCOSE 102* 93  BUN 23* 13  CREATININE 0.77 0.56  CALCIUM 8.7* 8.5*  MG 2.3 2.1   GFR: Estimated Creatinine Clearance: 78.5 mL/min (by C-G formula based on SCr of 0.56 mg/dL). Liver Function Tests: Recent Labs  Lab 07/03/21 1242 07/04/21 0349  AST 20 18  ALT 15 13  ALKPHOS 56 50  BILITOT 0.7 0.7  PROT 7.0 6.3*   ALBUMIN 3.6 3.2*   No results for input(s): LIPASE, AMYLASE in the last 168 hours. No results for input(s): AMMONIA in the last 168 hours. Coagulation Profile: Recent Labs  Lab 07/03/21 1448 07/04/21 0349  INR 1.0 1.0   Cardiac Enzymes: No results for input(s): CKTOTAL, CKMB, CKMBINDEX, TROPONINI in the last 168 hours. BNP (last 3 results) No results for input(s): PROBNP in the last 8760 hours. HbA1C: No results for input(s): HGBA1C in the last 72 hours. CBG: No results for input(s): GLUCAP in the last 168 hours. Lipid Profile: No results for input(s): CHOL, HDL, LDLCALC, TRIG, CHOLHDL, LDLDIRECT in the last 72 hours. Thyroid Function Tests: No results for input(s): TSH, T4TOTAL, FREET4, T3FREE, THYROIDAB in the last 72 hours. Anemia Panel: Recent Labs    07/03/21 1242  FOLATE 9.6  FERRITIN 6*  TIBC 392  IRON 125  RETICCTPCT 1.6   Sepsis Labs: No results for input(s): PROCALCITON, LATICACIDVEN in the last 168 hours.  Recent Results (from the past 240 hour(s))  Resp Panel by RT-PCR (Flu A&B, Covid) Nasopharyngeal Swab     Status: None   Collection Time: 07/03/21  6:42 PM   Specimen: Nasopharyngeal Swab; Nasopharyngeal(NP) swabs in vial transport medium  Result Value Ref Range Status   SARS Coronavirus 2 by RT PCR NEGATIVE NEGATIVE Final    Comment: (NOTE) SARS-CoV-2 target nucleic acids are NOT DETECTED.  The SARS-CoV-2 RNA is generally detectable in upper respiratory specimens during the acute phase of infection. The lowest concentration of SARS-CoV-2 viral copies this assay can detect is 138 copies/mL. A negative result does not preclude SARS-Cov-2 infection and should not be used as the sole basis for treatment or other patient management decisions. A negative result may occur with  improper specimen collection/handling, submission of specimen other than nasopharyngeal swab, presence of viral mutation(s) within the areas targeted by this assay, and inadequate  number of viral copies(<138 copies/mL). A negative result must be combined with clinical observations, patient history, and epidemiological information. The expected result is Negative.  Fact Sheet for Patients:  BloggerCourse.com  Fact Sheet for Healthcare Providers:  SeriousBroker.it  This test is no t yet approved or cleared by the [place] FDA and  has been authorized for detection and/or diagnosis of SARS-CoV-2 by FDA under an Emergency Use Authorization (EUA). This EUA will remain  in effect (meaning this test can be used) for the duration of the COVID-19 declaration under Section 564(b)(1) of the Act, 21 U.S.C.section 360bbb-3(b)(1), unless the authorization is terminated  or revoked sooner.       Influenza A by PCR NEGATIVE NEGATIVE Final   Influenza B by PCR NEGATIVE NEGATIVE Final    Comment: (NOTE) The Xpert Xpress SARS-CoV-2/FLU/RSV  plus assay is intended as an aid in the diagnosis of influenza from Nasopharyngeal swab specimens and should not be used as a sole basis for treatment. Nasal washings and aspirates are unacceptable for Xpert Xpress SARS-CoV-2/FLU/RSV testing.  Fact Sheet for Patients: BloggerCourse.com  Fact Sheet for Healthcare Providers: SeriousBroker.it  This test is not yet approved or cleared by the [place] FDA and has been authorized for detection and/or diagnosis of SARS-CoV-2 by FDA under an Emergency Use Authorization (EUA). This EUA will remain in effect (meaning this test can be used) for the duration of the COVID-19 declaration under Section 564(b)(1) of the Act, 21 U.S.C. section 360bbb-3(b)(1), unless the authorization is terminated or revoked.  Performed at [facility], [address] [place], [place] [address]   Culture, blood (Routine X 2) w Reflex to ID Panel     Status: None (Preliminary result)   Collection  Time: 07/03/21 10:18 PM   Specimen: BLOOD  Result Value Ref Range Status   Specimen Description BLOOD RIGHT HAND  Final   Special Requests   Final    BOTTLES DRAWN AEROBIC AND ANAEROBIC Blood Culture adequate volume   Culture   Final    NO GROWTH < 12 HOURS Performed at [facility], [address], [place], [place] [address]    Report Status PENDING  Incomplete  Culture, blood (Routine X 2) w Reflex to ID Panel     Status: None (Preliminary result)   Collection Time: 07/03/21 10:38 PM   Specimen: BLOOD  Result Value Ref Range Status   Specimen Description BLOOD LEFT AC  Final   Special Requests   Final    BOTTLES DRAWN AEROBIC AND ANAEROBIC Blood Culture results may not be optimal due to an inadequate volume of blood received in culture bottles   Culture   Final    NO GROWTH < 12 HOURS Performed at [facility], [address], [place], [place] [address]    Report Status PENDING  Incomplete      Radiology Studies: CT Angio Abd/Pel W and/or Wo Contrast  Result Date: 07/03/2021 CLINICAL DATA:  GI bleed evaluation.  Complains of rectal bleeding. EXAM: CTA ABDOMEN AND PELVIS WITHOUT AND WITH CONTRAST TECHNIQUE: Multidetector CT imaging of the abdomen and pelvis was performed using the standard protocol during bolus administration of intravenous contrast. Multiplanar reconstructed images and MIPs were obtained and reviewed to evaluate the vascular anatomy. CONTRAST:  OMNIPAQUE IOHEXOL 350 MG/ML SOLN COMPARISON:  CT abdomen and pelvis 11/24/2017 FINDINGS: VASCULAR Aorta: Normal caliber aorta without aneurysm, dissection, vasculitis or significant stenosis. Atherosclerotic calcifications. Celiac: Patent without evidence of aneurysm, dissection, vasculitis or significant stenosis. Variant hepatic anatomy. 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 SMA: Patent without evidence of  aneurysm, dissection, vasculitis or significant stenosis. Renals: Both renal arteries are patent without evidence of aneurysm, dissection, vasculitis, fibromuscular dysplasia or significant stenosis. IMA: Patent without evidence of aneurysm, dissection, vasculitis or significant stenosis. Inflow: Patent without evidence of aneurysm, dissection, vasculitis or significant stenosis. Proximal Outflow: Proximal femoral arteries are patent bilaterally. Veins: Iliac veins, IVC and renal veins are patent. Hepatic veins are patent. Main portal venous system is patent. Review of the MIP images confirms the above findings. NON-VASCULAR Lower chest: Lung bases are clear.  No pleural effusions. Hepatobiliary: Normal appearance of the liver, gallbladder and portal venous system. No biliary dilatation. Pancreas: Unremarkable. No pancreatic  ductal dilatation or surrounding inflammatory changes. Spleen: Normal in size without focal abnormality. Adrenals/Urinary Tract: Normal adrenal glands. Negative for kidney stones or hydronephrosis. 4 mm hypodensity in the medial left kidney is too small to definitively characterize. Fluid in the urinary bladder. The left side of the bladder dome appears to be tethered with a portion of the proximal sigmoid colon. Small pocket containing gas between the colon and the bladder is best seen on the coronal reformats, sequence 13, image 54. This pocket measures roughly 1.0 cm in size. No gas within the bladder lumen. Stomach/Bowel: Small hiatal hernia. Extensive colonic diverticulosis. Small focus of gas and fluid between the sigmoid colon and left side of the bladder dome on sequence 7, image 56. Minimal stranding in this area. No other areas are suggestive for bowel inflammation. There is no evidence for active GI bleeding. Normal appearance of the stomach. Negative for bowel obstruction. Lymphatic: No lymph node enlargement in the abdomen or pelvis. Reproductive: Status post hysterectomy. No  adnexal masses. Other: Negative for ascites.  Negative for free air. Musculoskeletal: Degenerative facet disease in the lower lumbar spine. No acute bone abnormality. IMPRESSION: VASCULAR 1. No evidence for active GI bleeding. 2.  Aortic Atherosclerosis (ICD10-I70.0). 3. Main visceral arteries are widely patent. Variant hepatic anatomy as described. 4. Portal venous system is patent. NON-VASCULAR 1. Small focus of inflammatory change between the left side of the bladder dome and the proximal sigmoid colon. There is a small collection in this area, measuring roughly 1 cm, that could represent a small abscess. There is no definite gas within the urinary bladder lumen. Minimal stranding around this inflammatory pocket between the colon and bladder. Findings are likely secondary to diverticulitis. Electronically Signed   By: Richarda OverlieAdam  Henn M.D.   On: 07/03/2021 16:06     Scheduled Meds:  amLODipine  2.5 mg Oral Daily   irbesartan  300 mg Oral Daily   And   hydrochlorothiazide  25 mg Oral Daily   pantoprazole (PROTONIX) IV  40 mg Intravenous Q24H   Continuous Infusions:  ciprofloxacin 400 mg (07/04/21 1728)   metronidazole 500 mg (07/04/21 1842)     LOS: 1 day     Darlin Priestlyina Marcoantonio Legault, MD [facility] If 7PM-7AM, please contact night-coverage 07/05/2021, 12:37 AM

## 2021-07-05 DIAGNOSIS — K922 Gastrointestinal hemorrhage, unspecified: Secondary | ICD-10-CM | POA: Diagnosis not present

## 2021-07-05 LAB — CBC
HCT: 33.7 % — ABNORMAL LOW (ref 36.0–46.0)
Hemoglobin: 11.7 g/dL — ABNORMAL LOW (ref 12.0–15.0)
MCH: 30 pg (ref 26.0–34.0)
MCHC: 34.7 g/dL (ref 30.0–36.0)
MCV: 86.4 fL (ref 80.0–100.0)
Platelets: 229 10*3/uL (ref 150–400)
RBC: 3.9 MIL/uL (ref 3.87–5.11)
RDW: 13.2 % (ref 11.5–15.5)
WBC: 5.4 10*3/uL (ref 4.0–10.5)
nRBC: 0 % (ref 0.0–0.2)

## 2021-07-05 LAB — BASIC METABOLIC PANEL
Anion gap: 8 (ref 5–15)
BUN: 15 mg/dL (ref 6–20)
CO2: 29 mmol/L (ref 22–32)
Calcium: 9.1 mg/dL (ref 8.9–10.3)
Chloride: 103 mmol/L (ref 98–111)
Creatinine, Ser: 0.7 mg/dL (ref 0.44–1.00)
GFR, Estimated: >60 mL/min (ref >60)
Glucose, Bld: 95 mg/dL (ref 70–99)
Potassium: 3.9 mmol/L (ref 3.5–5.1)
Sodium: 140 mmol/L (ref 135–145)

## 2021-07-05 LAB — MAGNESIUM: Magnesium: 2.3 mg/dL (ref 1.7–2.4)

## 2021-07-05 MED ORDER — ONDANSETRON HCL 4 MG PO TABS: 4.0000 mg | ORAL_TABLET | Freq: Every day | ORAL | 0 refills | Status: DC | PRN

## 2021-07-05 MED ORDER — PANTOPRAZOLE SODIUM 40 MG PO TBEC
40.0000 mg | DELAYED_RELEASE_TABLET | Freq: Every day | ORAL | Status: DC
  Administered 2021-07-05: 40 mg via ORAL
  Filled 2021-07-05: qty 1

## 2021-07-05 MED ORDER — CIPROFLOXACIN HCL 500 MG PO TABS: 500.0000 mg | ORAL_TABLET | Freq: Two times a day (BID) | ORAL | 0 refills | Status: AC

## 2021-07-05 MED ORDER — DICYCLOMINE HCL 10 MG PO CAPS: 10.0000 mg | ORAL_CAPSULE | Freq: Four times a day (QID) | ORAL | 0 refills | Status: DC | PRN

## 2021-07-05 MED ORDER — METRONIDAZOLE 500 MG PO TABS: 500.0000 mg | ORAL_TABLET | Freq: Three times a day (TID) | ORAL | 0 refills | Status: AC

## 2021-07-05 MED ORDER — PANTOPRAZOLE SODIUM 40 MG PO TBEC: 40.0000 mg | DELAYED_RELEASE_TABLET | Freq: Two times a day (BID) | ORAL | 0 refills | Status: DC

## 2021-07-05 NOTE — Plan of Care (Signed)
Patient discharged home per MD orders at this time.All discharge instructions,education and medications reviewed with patient at bedside.Pt expressed understanding and will comply with dc instructions.follow up appointments also communicated to patient.no verbal c/o or any ssx of distress.patient was transported home by family in a private car.

## 2021-07-05 NOTE — Discharge Summary (Signed)
Physician Discharge Summary  Adrienne Adkins BOF:[id number] DOB: [birth year] DOA: 07/03/2021  PCP: Gracelyn Nurse, MD  Admit date: 07/03/2021 Discharge date: 07/05/2021  Admitted From: Home Disposition: Home  Recommendations for Outpatient Follow-up:  Follow up with PCP in 1-2 weeks Establish care with gastroenterology  Home Health: No Equipment/Devices: None  Discharge Condition: Stable CODE STATUS: Full Diet recommendation: Bland/soft  Brief/Interim Summary: Adrienne Adkins is a 55 y.o. female with medical history significant for hypertension, GERD, who is admitted to [facility] on 07/03/2021 with suspected acute lower gastrointestinal bleed after presenting from home to [facility] ED complaining of bright red blood per rectum.  Suspected etiology of bleed diverticular disease.  Patient has extensive diverticulosis with evidence of diverticulitis.  Small abscess seen on imaging.  On day of discharge patient is tolerating p.o. intake.  Hemoglobin is stable in the 11's.  Long conversation with the patient about the nature of diverticular disease.  She had a colonoscopy approximately 7 years ago that was clean.  I advised her to return back to gastroenterology.  I provided name of a gastroenterologist she can establish care with.  Stable for discharge home at this time.  We will complete course of antibiotics for acute diverticulitis.  Home PPI increased to twice daily.  Also provided prescriptions for Bentyl and Zofran as needed.   Discharge Diagnoses:  Principal Problem:   Acute lower GI bleeding Active Problems:   Acute blood loss anemia   Abdominal pain   Intra-abdominal abscess (HCC)   Diverticulitis   Acute prerenal azotemia   GERD (gastroesophageal reflux disease)   Hypertension #) Acute lower GI bleed 2/2 Diverticulitis 2 episodes of bright red blood per rectum.  Hemoglobin remained stable.  Stable for discharge.  Increase home PPI to twice daily.  Complete course of  antibiotics for diverticulitis.  As needed Bentyl and Zofran provided.  Follow-up with PCP and gastroenterology on discharge.  Will need outpatient colonoscopy   #) Acute diverticulitis with small abscess --no fever, no leukocytosis. --Cipro and Flagyl initiated in the ED. Complete course of antibiotics total 10 days   Discharge Instructions  Discharge Instructions     Diet - low sodium heart healthy   Complete by: As directed    Increase activity slowly   Complete by: As directed       Allergies as of 07/05/2021       Reactions   Beta Adrenergic Blockers Other (See Comments)   Prolonged P waves on EKG   Sulfa Antibiotics Hives   Amoxicillin Hives, Rash        Medication List     STOP taking these medications    naproxen 500 MG tablet Commonly known as: Naprosyn   traMADol 50 MG tablet Commonly known as: Ultram       TAKE these medications    amLODipine 2.5 MG tablet Commonly known as: NORVASC Take 2.5 mg by mouth daily.   ciprofloxacin 500 MG tablet Commonly known as: Cipro Take 1 tablet (500 mg total) by mouth 2 (two) times daily for 9 days.   dicyclomine 10 MG capsule Commonly known as: Bentyl Take 1 capsule (10 mg total) by mouth 4 (four) times daily as needed for up to 14 days for spasms (Stomach cramps).   metroNIDAZOLE 500 MG tablet Commonly known as: Flagyl Take 1 tablet (500 mg total) by mouth 3 (three) times daily for 9 days.   ondansetron 4 MG tablet Commonly known as: Zofran Take 1 tablet (4 mg total)  by mouth daily as needed for nausea or vomiting.   pantoprazole 40 MG tablet Commonly known as: PROTONIX Take 1 tablet (40 mg total) by mouth 2 (two) times daily. What changed: when to take this   valsartan-hydrochlorothiazide 320-25 MG tablet Commonly known as: DIOVAN-HCT Take 1 tablet by mouth daily. Notes to patient: Not given in hospital        Follow-up Information     Gracelyn Nurse, MD. Go on 07/10/2021.   Specialty:  Internal Medicine Why: Appt @ 3:15 pm Contact information: [address] [place] [address] [phone]         Wyline Mood, MD. Go on 07/27/2021.   Specialty: Gastroenterology Why: Appt @ 8:45 am Contact information: [address] [place] [address] [phone]                Allergies  Allergen Reactions   Beta Adrenergic Blockers Other (See Comments)    Prolonged P waves on EKG   Sulfa Antibiotics Hives   Amoxicillin Hives and Rash    Consultations: None   Procedures/Studies: CT Angio Abd/Pel W and/or Wo Contrast  Result Date: 07/03/2021 CLINICAL DATA:  GI bleed evaluation.  Complains of rectal bleeding. EXAM: CTA ABDOMEN AND PELVIS WITHOUT AND WITH CONTRAST TECHNIQUE: Multidetector CT imaging of the abdomen and pelvis was performed using the standard protocol during bolus administration of intravenous contrast. Multiplanar reconstructed images and MIPs were obtained and reviewed to evaluate the vascular anatomy. CONTRAST:  OMNIPAQUE IOHEXOL 350 MG/ML SOLN COMPARISON:  CT abdomen and pelvis 11/24/2017 FINDINGS: VASCULAR Aorta: Normal caliber aorta without aneurysm, dissection, vasculitis or significant stenosis. Atherosclerotic calcifications. Celiac: Patent without evidence of aneurysm, dissection, vasculitis or significant stenosis. Variant hepatic anatomy. 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 SMA: Patent without evidence of aneurysm, dissection, vasculitis or significant stenosis. Renals: Both renal arteries are patent without evidence of aneurysm, dissection, vasculitis, fibromuscular dysplasia or significant stenosis. IMA: Patent without evidence of aneurysm, dissection, vasculitis or significant stenosis. Inflow: Patent without evidence of aneurysm, dissection, vasculitis or significant stenosis. Proximal Outflow: Proximal femoral arteries  are patent bilaterally. Veins: Iliac veins, IVC and renal veins are patent. Hepatic veins are patent. Main portal venous system is patent. Review of the MIP images confirms the above findings. NON-VASCULAR Lower chest: Lung bases are clear.  No pleural effusions. Hepatobiliary: Normal appearance of the liver, gallbladder and portal venous system. No biliary dilatation. Pancreas: Unremarkable. No pancreatic ductal dilatation or surrounding inflammatory changes. Spleen: Normal in size without focal abnormality. Adrenals/Urinary Tract: Normal adrenal glands. Negative for kidney stones or hydronephrosis. 4 mm hypodensity in the medial left kidney is too small to definitively characterize. Fluid in the urinary bladder. The left side of the bladder dome appears to be tethered with a portion of the proximal sigmoid colon. Small pocket containing gas between the colon and the bladder is best seen on the coronal reformats, sequence 13, image 54. This pocket measures roughly 1.0 cm in size. No gas within the bladder lumen. Stomach/Bowel: Small hiatal hernia. Extensive colonic diverticulosis. Small focus of gas and fluid between the sigmoid colon and left side of the bladder dome on sequence 7, image 56. Minimal stranding in this area. No other areas are suggestive for bowel inflammation. There is no evidence for active GI bleeding. Normal appearance of the stomach. Negative for bowel obstruction. Lymphatic: No lymph node enlargement  in the abdomen or pelvis. Reproductive: Status post hysterectomy. No adnexal masses. Other: Negative for ascites.  Negative for free air. Musculoskeletal: Degenerative facet disease in the lower lumbar spine. No acute bone abnormality. IMPRESSION: VASCULAR 1. No evidence for active GI bleeding. 2.  Aortic Atherosclerosis (ICD10-I70.0). 3. Main visceral arteries are widely patent. Variant hepatic anatomy as described. 4. Portal venous system is patent. NON-VASCULAR 1. Small focus of inflammatory  change between the left side of the bladder dome and the proximal sigmoid colon. There is a small collection in this area, measuring roughly 1 cm, that could represent a small abscess. There is no definite gas within the urinary bladder lumen. Minimal stranding around this inflammatory pocket between the colon and bladder. Findings are likely secondary to diverticulitis. Electronically Signed   By: Richarda Overlie M.D.   On: 07/03/2021 16:06   (Echo, Carotid, EGD, Colonoscopy, ERCP)    Subjective: Seen and examined on the day of discharge.  Stable no distress.  Tolerating p.o.  Stable for discharge home.  Outpatient GI follow-up.  Discharge Exam: Vitals:   07/05/21 0716 07/05/21 1204  BP: (!) 129/93 (!) 127/92  Pulse: (!) 55 71  Resp: 15 16  Temp: 98.6 F (37 C) 98.5 F (36.9 C)  SpO2: 99% 98%   Vitals:   07/04/21 2324 07/05/21 0609 07/05/21 0716 07/05/21 1204  BP: 113/81 (!) 130/98 (!) 129/93 (!) 127/92  Pulse: 67 65 (!) 55 71  Resp: Temp: 97.8 F (36.6 C) 97.9 F (36.6 C) 98.6 F (37 C) 98.5 F (36.9 C)  TempSrc:      SpO2: 100% 100% 99% 98%  Weight:      Height:        General: Pt is alert, awake, not in acute distress Cardiovascular: RRR, S1/S2 +, no rubs, no gallops Respiratory: CTA bilaterally, no wheezing, no rhonchi Abdominal: Soft, NT, ND, bowel sounds + Extremities: no edema, no cyanosis    The results of significant diagnostics from this hospitalization (including imaging, microbiology, ancillary and laboratory) are listed below for reference.     Microbiology: Recent Results (from the past 240 hour(s))  Resp Panel by RT-PCR (Flu A&B, Covid) Nasopharyngeal Swab     Status: None   Collection Time: 07/03/21  6:42 PM   Specimen: Nasopharyngeal Swab; Nasopharyngeal(NP) swabs in vial transport medium  Result Value Ref Range Status   SARS Coronavirus 2 by RT PCR NEGATIVE NEGATIVE Final    Comment: (NOTE) SARS-CoV-2 target nucleic acids are NOT  DETECTED.  The SARS-CoV-2 RNA is generally detectable in upper respiratory specimens during the acute phase of infection. The lowest concentration of SARS-CoV-2 viral copies this assay can detect is 138 copies/mL. A negative result does not preclude SARS-Cov-2 infection and should not be used as the sole basis for treatment or other patient management decisions. A negative result may occur with  improper specimen collection/handling, submission of specimen other than nasopharyngeal swab, presence of viral mutation(s) within the areas targeted by this assay, and inadequate number of viral copies(<138 copies/mL). A negative result must be combined with clinical observations, patient history, and epidemiological information. The expected result is Negative.  Fact Sheet for Patients:  BloggerCourse.com  Fact Sheet for Healthcare Providers:  SeriousBroker.it  This test is no t yet approved or cleared by the [place] FDA and  has been authorized for detection and/or diagnosis of SARS-CoV-2 by FDA under an Emergency Use Authorization (EUA). This EUA will remain  in effect (  meaning this test can be used) for the duration of the COVID-19 declaration under Section 564(b)(1) of the Act, 21 U.S.C.section 360bbb-3(b)(1), unless the authorization is terminated  or revoked sooner.       Influenza A by PCR NEGATIVE NEGATIVE Final   Influenza B by PCR NEGATIVE NEGATIVE Final    Comment: (NOTE) The Xpert Xpress SARS-CoV-2/FLU/RSV plus assay is intended as an aid in the diagnosis of influenza from Nasopharyngeal swab specimens and should not be used as a sole basis for treatment. Nasal washings and aspirates are unacceptable for Xpert Xpress SARS-CoV-2/FLU/RSV testing.  Fact Sheet for Patients: BloggerCourse.comhttps://www.fda.gov/media/152166/download  Fact Sheet for Healthcare Providers: SeriousBroker.ithttps://www.fda.gov/media/152162/download  This test is not yet  approved or cleared by the [place] FDA and has been authorized for detection and/or diagnosis of SARS-CoV-2 by FDA under an Emergency Use Authorization (EUA). This EUA will remain in effect (meaning this test can be used) for the duration of the COVID-19 declaration under Section 564(b)(1) of the Act, 21 U.S.C. section 360bbb-3(b)(1), unless the authorization is terminated or revoked.  Performed at [facility], [address] [place], [place] [address]   Culture, blood (Routine X 2) w Reflex to ID Panel     Status: None (Preliminary result)   Collection Time: 07/03/21 10:18 PM   Specimen: BLOOD  Result Value Ref Range Status   Specimen Description BLOOD RIGHT HAND  Final   Special Requests   Final    BOTTLES DRAWN AEROBIC AND ANAEROBIC Blood Culture adequate volume   Culture   Final    NO GROWTH 2 DAYS Performed at [facility], [address] [place], [place] [address]    Report Status PENDING  Incomplete  Culture, blood (Routine X 2) w Reflex to ID Panel     Status: None (Preliminary result)   Collection Time: 07/03/21 10:38 PM   Specimen: BLOOD  Result Value Ref Range Status   Specimen Description BLOOD LEFT AC  Final   Special Requests   Final    BOTTLES DRAWN AEROBIC AND ANAEROBIC Blood Culture results may not be optimal due to an inadequate volume of blood received in culture bottles   Culture   Final    NO GROWTH 2 DAYS Performed at [facility], [address] [place], [place] [address]    Report Status PENDING  Incomplete     Labs: BNP (last 3 results) No results for input(s): BNP in the last 8760 hours. Basic Metabolic Panel: Recent Labs  Lab 07/03/21 1242 07/04/21 0349 07/05/21 0509  NA 139 140 140  K 3.5 3.3* 3.9  CL 103 106 103  CO2 27 28 29   GLUCOSE 102* 93 95  BUN 23* 13 15  CREATININE 0.77 0.56 0.70  CALCIUM 8.7* 8.5* 9.1  MG 2.3 2.1 2.3   Liver Function Tests: Recent Labs  Lab 07/03/21 1242  07/04/21 0349  AST 20 18  ALT 15 13  ALKPHOS 56 50  BILITOT 0.7 0.7  PROT 7.0 6.3*  ALBUMIN 3.6 3.2*   No results for input(s): LIPASE, AMYLASE in the last 168 hours. No results for input(s): AMMONIA in the last 168 hours. CBC: Recent Labs  Lab 07/03/21 1242 07/03/21 1500 07/03/21 2236 07/04/21 0349 07/04/21 0907 07/05/21 0509  WBC 7.2 7.7  --  5.4  --  5.4  HGB 12.1 12.7 11.8* 11.4* 11.7* 11.7*  HCT 35.0* 37.0 33.6* 33.5* 33.9* 33.7*  MCV 85.0 84.9  --  84.2  --  86.4  PLT 260 258  --  229  --  229   Cardiac Enzymes: No results for input(s): CKTOTAL, CKMB, CKMBINDEX, TROPONINI in the last 168 hours. BNP: Invalid input(s): POCBNP CBG: No results for input(s): GLUCAP in the last 168 hours. D-Dimer No results for input(s): DDIMER in the last 72 hours. Hgb A1c No results for input(s): HGBA1C in the last 72 hours. Lipid Profile No results for input(s): CHOL, HDL, LDLCALC, TRIG, CHOLHDL, LDLDIRECT in the last 72 hours. Thyroid function studies No results for input(s): TSH, T4TOTAL, T3FREE, THYROIDAB in the last 72 hours.  Invalid input(s): FREET3 Anemia work up Recent Labs    07/03/21 1242  FOLATE 9.6  FERRITIN 6*  TIBC 392  IRON 125  RETICCTPCT 1.6   Urinalysis    Component Value Date/Time   COLORURINE YELLOW (A) 07/04/2021 0611   APPEARANCEUR CLEAR (A) 07/04/2021 0611   LABSPEC 1.016 07/04/2021 0611   PHURINE 7.0 07/04/2021 0611   GLUCOSEU NEGATIVE 07/04/2021 0611   HGBUR NEGATIVE 07/04/2021 0611   BILIRUBINUR NEGATIVE 07/04/2021 0611   KETONESUR NEGATIVE 07/04/2021 0611   PROTEINUR NEGATIVE 07/04/2021 0611   NITRITE NEGATIVE 07/04/2021 0611   LEUKOCYTESUR TRACE (A) 07/04/2021 0611   Sepsis Labs Invalid input(s): PROCALCITONIN,  WBC,  LACTICIDVEN Microbiology Recent Results (from the past 240 hour(s))  Resp Panel by RT-PCR (Flu A&B, Covid) Nasopharyngeal Swab     Status: None   Collection Time: 07/03/21  6:42 PM   Specimen: Nasopharyngeal Swab;  Nasopharyngeal(NP) swabs in vial transport medium  Result Value Ref Range Status   SARS Coronavirus 2 by RT PCR NEGATIVE NEGATIVE Final    Comment: (NOTE) SARS-CoV-2 target nucleic acids are NOT DETECTED.  The SARS-CoV-2 RNA is generally detectable in upper respiratory specimens during the acute phase of infection. The lowest concentration of SARS-CoV-2 viral copies this assay can detect is 138 copies/mL. A negative result does not preclude SARS-Cov-2 infection and should not be used as the sole basis for treatment or other patient management decisions. A negative result may occur with  improper specimen collection/handling, submission of specimen other than nasopharyngeal swab, presence of viral mutation(s) within the areas targeted by this assay, and inadequate number of viral copies(<138 copies/mL). A negative result must be combined with clinical observations, patient history, and epidemiological information. The expected result is Negative.  Fact Sheet for Patients:  BloggerCourse.com  Fact Sheet for Healthcare Providers:  SeriousBroker.it  This test is no t yet approved or cleared by the [place] FDA and  has been authorized for detection and/or diagnosis of SARS-CoV-2 by FDA under an Emergency Use Authorization (EUA). This EUA will remain  in effect (meaning this test can be used) for the duration of the COVID-19 declaration under Section 564(b)(1) of the Act, 21 U.S.C.section 360bbb-3(b)(1), unless the authorization is terminated  or revoked sooner.       Influenza A by PCR NEGATIVE NEGATIVE Final   Influenza B by PCR NEGATIVE NEGATIVE Final    Comment: (NOTE) The Xpert Xpress SARS-CoV-2/FLU/RSV plus assay is intended as an aid in the diagnosis of influenza from Nasopharyngeal swab specimens and should not be used as a sole basis for treatment. Nasal washings and aspirates are unacceptable for Xpert Xpress  SARS-CoV-2/FLU/RSV testing.  Fact Sheet for Patients: BloggerCourse.com  Fact Sheet for Healthcare Providers: SeriousBroker.it  This test is not yet approved or cleared by the [place] FDA and has been authorized for detection and/or diagnosis of SARS-CoV-2 by FDA under an Emergency Use Authorization (  EUA). This EUA will remain in effect (meaning this test can be used) for the duration of the COVID-19 declaration under Section 564(b)(1) of the Act, 21 U.S.C. section 360bbb-3(b)(1), unless the authorization is terminated or revoked.  Performed at [facility], [address] [place], [place] [address]   Culture, blood (Routine X 2) w Reflex to ID Panel     Status: None (Preliminary result)   Collection Time: 07/03/21 10:18 PM   Specimen: BLOOD  Result Value Ref Range Status   Specimen Description BLOOD RIGHT HAND  Final   Special Requests   Final    BOTTLES DRAWN AEROBIC AND ANAEROBIC Blood Culture adequate volume   Culture   Final    NO GROWTH 2 DAYS Performed at [facility], [address], [place], [place] [address]    Report Status PENDING  Incomplete  Culture, blood (Routine X 2) w Reflex to ID Panel     Status: None (Preliminary result)   Collection Time: 07/03/21 10:38 PM   Specimen: BLOOD  Result Value Ref Range Status   Specimen Description BLOOD LEFT AC  Final   Special Requests   Final    BOTTLES DRAWN AEROBIC AND ANAEROBIC Blood Culture results may not be optimal due to an inadequate volume of blood received in culture bottles   Culture   Final    NO GROWTH 2 DAYS Performed at [facility], [address], [place], [place] [address]    Report Status PENDING  Incomplete     Time coordinating discharge: Over 30 minutes  SIGNED:   Tresa Moore, MD  [facility] 07/05/2021, 2:15 PM Pager   If 7PM-7AM, please contact night-coverage

## 2021-07-06 LAB — METHYLMALONIC ACID, SERUM: Methylmalonic Acid, Quantitative: 131 nmol/L (ref 0–378)

## 2021-07-08 LAB — CULTURE, BLOOD (ROUTINE X 2)
Culture: NO GROWTH
Culture: NO GROWTH
Special Requests: ADEQUATE

## 2021-08-02 ENCOUNTER — Other Ambulatory Visit: Payer: Self-pay

## 2021-08-02 ENCOUNTER — Ambulatory Visit (INDEPENDENT_AMBULATORY_CARE_PROVIDER_SITE_OTHER): Payer: No Typology Code available for payment source | Admitting: Gastroenterology

## 2021-08-02 ENCOUNTER — Encounter: Payer: Self-pay | Admitting: Gastroenterology

## 2021-08-02 VITALS — BP 137/82 | HR 80 | Temp 99.0°F | Ht 61.5 in | Wt 182.2 lb

## 2021-08-02 DIAGNOSIS — Z8719 Personal history of other diseases of the digestive system: Secondary | ICD-10-CM

## 2021-08-02 DIAGNOSIS — E669 Obesity, unspecified: Secondary | ICD-10-CM | POA: Insufficient documentation

## 2021-08-02 MED ORDER — CLENPIQ 10-3.5-12 MG-GM -GM/160ML PO SOLN: ORAL | 0 refills | Status: DC

## 2021-08-02 NOTE — Progress Notes (Signed)
Wyline Mood MD, MRCP(U.K) [address]  [address]  [place], [place] [address]  Main: [phone]  Fax: [phone]   Gastroenterology Consultation  Referring Provider:     Gracelyn Nurse, MD Primary Care Physician:  Gracelyn Nurse, MD Primary Gastroenterologist:  Dr. Wyline Mood  Reason for Consultation: GI bleed            HPI:   Adrienne Adkins is a 55 y.o. y/o female referred for consultation & management  by Dr. Letitia Libra, Beulah Gandy, MD.    Patient was admitted on 07/03/2021 and discharged on 07/05/2021 when she presented with bright red blood per rectum and suspected etiology was a diverticular bleed.  Was found to have diverticulitis and a small abscess on imaging.  Treated conservatively.  Her last colonoscopy was over 7 years back.  Discharged home.   07/05/2021: Hemoglobin 11.7 g 07/03/2021: CT angiogram abdomen pelvis showed no evidence of active GI bleed colonic diverticulosis noted.Small area in the left side of the bladder and proximal sigmoid colon 1 cm in size could that could represent a small abscess.  Presently has no abdominal pain.  Normal bowel movements except feels more constipated.  Has tried few over-the-counter agents with not much success.  No family history of colon cancer or polyps.  Denies any rectal bleeding.   Past Medical History:  Diagnosis Date   Carpal tunnel syndrome    COVID-19 06/2019   Dermatitis    Diverticular disease    GERD (gastroesophageal reflux disease)    History of kidney stones    Hypertension    IGT (impaired glucose tolerance)    Lumbago    Migraines    Palpitations    Vertigo     Past Surgical History:  Procedure Laterality Date   ABDOMINAL HYSTERECTOMY     APPENDECTOMY     COLONOSCOPY     INCISION AND DRAINAGE ABSCESS Left 02/02/2021   Procedure: INCISION AND DRAINAGE ABSCESS;  Surgeon: Sung Amabile, DO;  Location: [facility] ORS;  Service: General;  Laterality: Left;  IODOFORM PACKING    INDUCED ABORTION      OOPHORECTOMY      Prior to Admission medications   Medication Sig Start Date End Date Taking? Authorizing Provider  amLODipine (NORVASC) 2.5 MG tablet Take 2.5 mg by mouth daily. 10/11/16   [provider]  dicyclomine (BENTYL) 10 MG capsule Take 1 capsule (10 mg total) by mouth 4 (four) times daily as needed for up to 14 days for spasms (Stomach cramps). 07/05/21 07/19/21  Tresa Moore, MD  ondansetron (ZOFRAN) 4 MG tablet Take 1 tablet (4 mg total) by mouth daily as needed for nausea or vomiting. 07/05/21 07/05/22  Tresa Moore, MD  pantoprazole (PROTONIX) 40 MG tablet Take 1 tablet (40 mg total) by mouth 2 (two) times daily. 07/05/21 08/04/21  Tresa Moore, MD  valsartan-hydrochlorothiazide (DIOVAN-HCT) 320-25 MG tablet Take 1 tablet by mouth daily.     [provider]    Family History  Problem Relation Age of Onset   Breast cancer Maternal Aunt      Social History   Tobacco Use   Smoking status: Every Day    Types: Cigarettes   Smokeless tobacco: Never  Vaping Use   Vaping Use: Never used  Substance Use Topics   Alcohol use: Yes    Comment: social   Drug use: No    Allergies as of 08/02/2021 - Review Complete 07/03/2021  Allergen Reaction Noted  Beta adrenergic blockers Other (See Comments) 02/23/2016   Sulfa antibiotics Hives 02/01/2021   Amoxicillin Hives and Rash 02/23/2016    Review of Systems:    All systems reviewed and negative except where noted in HPI.   Physical Exam:  There were no vitals taken for this visit. No LMP recorded. Patient has had a hysterectomy. Psych:  Alert and cooperative. Normal mood and affect. General:   Alert,  Well-developed, well-nourished, pleasant and cooperative in NAD Head:  Normocephalic and atraumatic. Eyes:  Sclera clear, no icterus.   Conjunctiva pink. Ears:  Normal auditory acuity. Nose:  No deformity, discharge, or lesions. Mouth:  No deformity or lesions,oropharynx pink & moist. Neck:   Supple; no masses or thyromegaly. Lungs:  Respirations even and unlabored.  Clear throughout to auscultation.   No wheezes, crackles, or rhonchi. No acute distress. Heart:  Regular rate and rhythm; no murmurs, clicks, rubs, or gallops. Abdomen:  Normal bowel sounds.  No bruits.  Soft, non-tender and non-distended without masses, hepatosplenomegaly or hernias noted.  No guarding or rebound tenderness.    Neurologic:  Alert and oriented x3;  grossly normal neurologically. Psych:  Alert and cooperative. Normal mood and affect.  Imaging Studies: CT Angio Abd/Pel W and/or Wo Contrast  Result Date: 07/03/2021 CLINICAL DATA:  GI bleed evaluation.  Complains of rectal bleeding. EXAM: CTA ABDOMEN AND PELVIS WITHOUT AND WITH CONTRAST TECHNIQUE: Multidetector CT imaging of the abdomen and pelvis was performed using the standard protocol during bolus administration of intravenous contrast. Multiplanar reconstructed images and MIPs were obtained and reviewed to evaluate the vascular anatomy. CONTRAST:  OMNIPAQUE IOHEXOL 350 MG/ML SOLN COMPARISON:  CT abdomen and pelvis 11/24/2017 FINDINGS: VASCULAR Aorta: Normal caliber aorta without aneurysm, dissection, vasculitis or significant stenosis. Atherosclerotic calcifications. Celiac: Patent without evidence of aneurysm, dissection, vasculitis or significant stenosis. Variant hepatic anatomy. 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 SMA: Patent without evidence of aneurysm, dissection, vasculitis or significant stenosis. Renals: Both renal arteries are patent without evidence of aneurysm, dissection, vasculitis, fibromuscular dysplasia or significant stenosis. IMA: Patent without evidence of aneurysm, dissection, vasculitis or significant stenosis. Inflow: Patent without evidence of aneurysm, dissection, vasculitis or significant stenosis. Proximal Outflow: Proximal femoral  arteries are patent bilaterally. Veins: Iliac veins, IVC and renal veins are patent. Hepatic veins are patent. Main portal venous system is patent. Review of the MIP images confirms the above findings. NON-VASCULAR Lower chest: Lung bases are clear.  No pleural effusions. Hepatobiliary: Normal appearance of the liver, gallbladder and portal venous system. No biliary dilatation. Pancreas: Unremarkable. No pancreatic ductal dilatation or surrounding inflammatory changes. Spleen: Normal in size without focal abnormality. Adrenals/Urinary Tract: Normal adrenal glands. Negative for kidney stones or hydronephrosis. 4 mm hypodensity in the medial left kidney is too small to definitively characterize. Fluid in the urinary bladder. The left side of the bladder dome appears to be tethered with a portion of the proximal sigmoid colon. Small pocket containing gas between the colon and the bladder is best seen on the coronal reformats, sequence 13, image 54. This pocket measures roughly 1.0 cm in size. No gas within the bladder lumen. Stomach/Bowel: Small hiatal hernia. Extensive colonic diverticulosis. Small focus of gas and fluid between the sigmoid colon and left side of the bladder dome on sequence 7, image 56. Minimal stranding in this area. No other areas are suggestive for bowel inflammation. There is no evidence  for active GI bleeding. Normal appearance of the stomach. Negative for bowel obstruction. Lymphatic: No lymph node enlargement in the abdomen or pelvis. Reproductive: Status post hysterectomy. No adnexal masses. Other: Negative for ascites.  Negative for free air. Musculoskeletal: Degenerative facet disease in the lower lumbar spine. No acute bone abnormality. IMPRESSION: VASCULAR 1. No evidence for active GI bleeding. 2.  Aortic Atherosclerosis (ICD10-I70.0). 3. Main visceral arteries are widely patent. Variant hepatic anatomy as described. 4. Portal venous system is patent. NON-VASCULAR 1. Small focus of  inflammatory change between the left side of the bladder dome and the proximal sigmoid colon. There is a small collection in this area, measuring roughly 1 cm, that could represent a small abscess. There is no definite gas within the urinary bladder lumen. Minimal stranding around this inflammatory pocket between the colon and bladder. Findings are likely secondary to diverticulitis. Electronically Signed   By: Richarda Overlie M.D.   On: 07/03/2021 16:06    Assessment and Plan:   Adrienne Adkins is a 55 y.o. y/o female has been referred for a lower GI bleed.  She was discharged in the hospital on 07/15/2021 when she was admitted with a lower GI bleed felt secondary to diverticular hemorrhage.  An area of possible diverticulitis was also noted.  Last colonoscopy was over 7 years back.  She would need a repeat colonoscopy due to the episode of diverticulitis and not having a procedure within the past 1 year..  I will let her diverticulitis heal for about 6 weeks and we will plan for a colonoscopy in October.  Suggested trial of daily 1 capful of MiraLAX for constipation.  I have discussed alternative options, risks & benefits,  which include, but are not limited to, bleeding, infection, perforation,respiratory complication & drug reaction.  The patient agrees with this plan & written consent will be obtained.     Follow up in as needed  Dr Wyline Mood MD,MRCP(U.K)

## 2021-08-03 ENCOUNTER — Telehealth: Payer: Self-pay

## 2021-08-03 NOTE — Telephone Encounter (Signed)
Pt. Calling she is concerned about having someone to come with her to her procedure before she schedules it. She said she is ready to schedule but she has a few questions first

## 2021-08-08 ENCOUNTER — Other Ambulatory Visit: Payer: Self-pay

## 2021-08-08 DIAGNOSIS — Z8719 Personal history of other diseases of the digestive system: Secondary | ICD-10-CM

## 2021-08-08 NOTE — Addendum Note (Signed)
Addended by: Adela Ports on: 08/08/2021 11:46 AM   Modules accepted: Orders

## 2021-08-10 NOTE — Telephone Encounter (Signed)
Patient was called twice and left her a voicemail to return my call.

## 2021-08-11 NOTE — Telephone Encounter (Signed)
Called patient back and she had some questions about there time of the procedure. I told her that we only provided with the date but that the endoscopy unit is the one to provide the time. Patient also want to know if her driver could drop her off and then go. I told her that while I put her on hold I called the endoscopy unit and I was told that she could be dropped off and picked up within the 15 minutes of her being done. Patient understood and had no further questions.

## 2021-09-08 ENCOUNTER — Telehealth: Payer: Self-pay

## 2021-09-08 ENCOUNTER — Ambulatory Visit: Payer: PRIVATE HEALTH INSURANCE | Admitting: Anesthesiology

## 2021-09-08 ENCOUNTER — Ambulatory Visit
Admission: RE | Admit: 2021-09-08 | Discharge: 2021-09-08 | Disposition: A | Discharge status: Home / Self Care | Payer: PRIVATE HEALTH INSURANCE | Source: Home / Self Care | Attending: Gastroenterology | Admitting: Gastroenterology

## 2021-09-08 ENCOUNTER — Encounter
Admission: RE | Disposition: A | Discharge status: Home / Self Care | Payer: Self-pay | Source: Home / Self Care | Attending: Gastroenterology

## 2021-09-08 SURGERY — COLONOSCOPY
Anesthesia: General

## 2021-09-08 MED ORDER — SUTAB 1479-225-188 MG PO TABS: 1.0000 | ORAL_TABLET | Freq: Once | ORAL | 0 refills | Status: AC

## 2021-09-08 NOTE — Telephone Encounter (Signed)
Called patient and apologized for the confusion. However, my mistake of ordering the wrong procedure was caught on time thanks to Winger. So right after that, I Daralene Milch to let her know so she could get it authorized. All of this was done behind the scenes. Therefore, the patient was given the right instructions verbally and written. However, the patient did what she wanted to do because she took half of the Clenpiq to prep and stated that she was prepping for an upper endoscopy when clearly she had a colonoscopy instruction. Patient even stated that she read the instructions but in her mind she stated that she and Dr. Tobi Bastos had discussed an upper endoscopy but not a colonoscopy. I explained to the patient that she needs a colonoscopy because she has diverticulitis.Patient continued to be addiment about only getting an upper endoscopy as dicussed on her office visit with Dr. Tobi Bastos. I then told her that I was calling her to reschedule her colonoscopy and she stated that she did not have anybody to bring her and she needed to look for someone to bring her. Patient also stated that she couldn't drink the entire Clenpiq as instructed because she stated that it smelled and tasted nasty and that it caused her to gag all the time she was drinking the one bottle and therefore not drink the second bottle. I told her that unfortunately she would need to clean her colon in order for Dr.Anna to be able to proceed with the colonoscopy. She then stated that she did not know she would to reschedule because of how nasty that medicine was. I offered her to take Sutabs and hopefully it would be better for her and at the end she agreed. Patient stated that she was not ready to schedule anything yet until she could find somebody to bring her and then she would call me back.

## 2021-09-08 NOTE — OR Nursing (Signed)
States she understands she is here for an upper endoscopy, not colonoscopy. She did take 1/2 of a colon prep, but did not complete second bottle. State stool is liquid but medium brown and cannot see through to bottom of toilet. Dr. Tobi Bastos in to speak with patient and explained upper endoscopy was not indicated due to bleeding being diverticular and per rectum. Patient to reschedule for future date due to inadequacy of the prep.

## 2021-09-08 NOTE — Anesthesia Preprocedure Evaluation (Deleted)
Anesthesia Evaluation  Patient identified by MRN, date of birth, ID band Patient awake    Reviewed: Allergy & Precautions, NPO status , Patient's Chart, lab work & pertinent test results  History of Anesthesia Complications Negative for: history of anesthetic complications  Airway Mallampati: IV   Neck ROM: Full    Dental no notable dental hx.    Pulmonary Current Smoker (3 cigarettes per day) and Patient abstained from smoking.,    Pulmonary exam normal breath sounds clear to auscultation       Cardiovascular hypertension, Normal cardiovascular exam Rhythm:Regular Rate:Normal  ECG 02/02/21: normal   Neuro/Psych  Headaches, Vertigo     GI/Hepatic GERD  ,  Endo/Other  Obesity   Renal/GU negative Renal ROS     Musculoskeletal   Abdominal   Peds  Hematology negative hematology ROS (+)   Anesthesia Other Findings   Reproductive/Obstetrics                             Anesthesia Physical Anesthesia Plan  ASA: 2  Anesthesia Plan: General   Post-op Pain Management:    Induction: Intravenous  PONV Risk Score and Plan: Propofol infusion, TIVA and Treatment may vary due to age or medical condition  Airway Management Planned: Natural Airway  Additional Equipment:   Intra-op Plan:   Post-operative Plan:   Informed Consent: I have reviewed the patients History and Physical, chart, labs and discussed the procedure including the risks, benefits and alternatives for the proposed anesthesia with the patient or authorized representative who has indicated his/her understanding and acceptance.       Plan Discussed with: CRNA  Anesthesia Plan Comments:         Anesthesia Quick Evaluation

## 2021-09-08 NOTE — Telephone Encounter (Signed)
-----   Message from Ginger Kotlik, CMA sent at 09/08/2021  8:50 AM EDT ----- Pt was suppose to have a Colonoscopy today but the original orders were put in for an EGD. Pt needs to be contacted to reschedule. She got cancelled today because she was confused and didn't prep like she was suppose to. Pt left endo crying.   Ginger

## 2021-09-19 ENCOUNTER — Other Ambulatory Visit: Payer: Self-pay

## 2021-09-19 ENCOUNTER — Encounter: Payer: Self-pay | Admitting: Emergency Medicine

## 2021-09-19 DIAGNOSIS — I10 Essential (primary) hypertension: Secondary | ICD-10-CM | POA: Diagnosis present

## 2021-09-19 DIAGNOSIS — Z20822 Contact with and (suspected) exposure to covid-19: Secondary | ICD-10-CM | POA: Diagnosis present

## 2021-09-19 DIAGNOSIS — Z8616 Personal history of COVID-19: Secondary | ICD-10-CM

## 2021-09-19 DIAGNOSIS — F17210 Nicotine dependence, cigarettes, uncomplicated: Secondary | ICD-10-CM | POA: Diagnosis present

## 2021-09-19 DIAGNOSIS — K5732 Diverticulitis of large intestine without perforation or abscess without bleeding: Secondary | ICD-10-CM | POA: Diagnosis not present

## 2021-09-19 DIAGNOSIS — N321 Vesicointestinal fistula: Secondary | ICD-10-CM | POA: Diagnosis present

## 2021-09-19 DIAGNOSIS — Z90710 Acquired absence of both cervix and uterus: Secondary | ICD-10-CM

## 2021-09-19 LAB — CBC
HCT: 29.5 % — ABNORMAL LOW (ref 36.0–46.0)
Hemoglobin: 9.6 g/dL — ABNORMAL LOW (ref 12.0–15.0)
MCH: 23.4 pg — ABNORMAL LOW (ref 26.0–34.0)
MCHC: 32.5 g/dL (ref 30.0–36.0)
MCV: 72 fL — ABNORMAL LOW (ref 80.0–100.0)
Platelets: 466 10*3/uL — ABNORMAL HIGH (ref 150–400)
RBC: 4.1 MIL/uL (ref 3.87–5.11)
RDW: 18 % — ABNORMAL HIGH (ref 11.5–15.5)
WBC: 11.9 10*3/uL — ABNORMAL HIGH (ref 4.0–10.5)
nRBC: 0 % (ref 0.0–0.2)

## 2021-09-19 LAB — URINALYSIS, ROUTINE W REFLEX MICROSCOPIC
Bacteria, UA: NONE SEEN
Bilirubin Urine: NEGATIVE
Glucose, UA: NEGATIVE mg/dL
Hgb urine dipstick: NEGATIVE
Ketones, ur: NEGATIVE mg/dL
Nitrite: NEGATIVE
Protein, ur: NEGATIVE mg/dL
Specific Gravity, Urine: 1.014 (ref 1.005–1.030)
pH: 6 (ref 5.0–8.0)

## 2021-09-19 LAB — COMPREHENSIVE METABOLIC PANEL
ALT: 13 U/L (ref 0–44)
AST: 16 U/L (ref 15–41)
Albumin: 3.9 g/dL (ref 3.5–5.0)
Alkaline Phosphatase: 67 U/L (ref 38–126)
Anion gap: 8 (ref 5–15)
BUN: 14 mg/dL (ref 6–20)
CO2: 25 mmol/L (ref 22–32)
Calcium: 8.7 mg/dL — ABNORMAL LOW (ref 8.9–10.3)
Chloride: 102 mmol/L (ref 98–111)
Creatinine, Ser: 0.5 mg/dL (ref 0.44–1.00)
GFR, Estimated: >60 mL/min (ref >60)
Glucose, Bld: 123 mg/dL — ABNORMAL HIGH (ref 70–99)
Potassium: 3.4 mmol/L — ABNORMAL LOW (ref 3.5–5.1)
Sodium: 135 mmol/L (ref 135–145)
Total Bilirubin: 0.8 mg/dL (ref 0.3–1.2)
Total Protein: 7.8 g/dL (ref 6.5–8.1)

## 2021-09-19 LAB — LIPASE, BLOOD: Lipase: 31 U/L (ref 11–51)

## 2021-09-19 NOTE — ED Triage Notes (Signed)
Pt to ED from home c/o lower mid abd pain that started suddenly this evening like stabbing pain.  Denies n/v/d, but pain with urination, denies bleeding or discharge.  States took tramadol and also cipro (from prior diverticulitis) without relief.  Pt A&Ox4, chest rise even and unlabored, skin WNL, and in NAD at this time.

## 2021-09-20 ENCOUNTER — Emergency Department: Payer: PRIVATE HEALTH INSURANCE

## 2021-09-20 ENCOUNTER — Inpatient Hospital Stay
Admission: EM | Admit: 2021-09-20 | Discharge: 2021-09-25 | DRG: 392 | Disposition: A | Discharge status: Home / Self Care | Payer: PRIVATE HEALTH INSURANCE | Attending: Surgery | Admitting: Surgery

## 2021-09-20 ENCOUNTER — Other Ambulatory Visit: Payer: Self-pay | Admitting: Urology

## 2021-09-20 DIAGNOSIS — Z90710 Acquired absence of both cervix and uterus: Secondary | ICD-10-CM | POA: Diagnosis not present

## 2021-09-20 DIAGNOSIS — R300 Dysuria: Secondary | ICD-10-CM

## 2021-09-20 DIAGNOSIS — I10 Essential (primary) hypertension: Secondary | ICD-10-CM | POA: Diagnosis present

## 2021-09-20 DIAGNOSIS — Z20822 Contact with and (suspected) exposure to covid-19: Secondary | ICD-10-CM | POA: Diagnosis present

## 2021-09-20 DIAGNOSIS — N321 Vesicointestinal fistula: Secondary | ICD-10-CM | POA: Diagnosis present

## 2021-09-20 DIAGNOSIS — K5732 Diverticulitis of large intestine without perforation or abscess without bleeding: Principal | ICD-10-CM

## 2021-09-20 DIAGNOSIS — Z8616 Personal history of COVID-19: Secondary | ICD-10-CM | POA: Diagnosis not present

## 2021-09-20 DIAGNOSIS — F17210 Nicotine dependence, cigarettes, uncomplicated: Secondary | ICD-10-CM | POA: Diagnosis present

## 2021-09-20 LAB — RESP PANEL BY RT-PCR (FLU A&B, COVID) ARPGX2
Influenza A by PCR: NEGATIVE
Influenza B by PCR: NEGATIVE
SARS Coronavirus 2 by RT PCR: NEGATIVE

## 2021-09-20 LAB — CBC
HCT: 28 % — ABNORMAL LOW (ref 36.0–46.0)
Hemoglobin: 8.7 g/dL — ABNORMAL LOW (ref 12.0–15.0)
MCH: 23.6 pg — ABNORMAL LOW (ref 26.0–34.0)
MCHC: 31.1 g/dL (ref 30.0–36.0)
MCV: 75.9 fL — ABNORMAL LOW (ref 80.0–100.0)
Platelets: 410 10*3/uL — ABNORMAL HIGH (ref 150–400)
RBC: 3.69 MIL/uL — ABNORMAL LOW (ref 3.87–5.11)
RDW: 18.1 % — ABNORMAL HIGH (ref 11.5–15.5)
WBC: 8.3 10*3/uL (ref 4.0–10.5)
nRBC: 0 % (ref 0.0–0.2)

## 2021-09-20 LAB — CREATININE, SERUM
Creatinine, Ser: 0.37 mg/dL — ABNORMAL LOW (ref 0.44–1.00)
GFR, Estimated: >60 mL/min (ref >60)

## 2021-09-20 MED ORDER — PANTOPRAZOLE SODIUM 40 MG PO TBEC
40.0000 mg | DELAYED_RELEASE_TABLET | Freq: Every day | ORAL | Status: DC
  Administered 2021-09-20 – 2021-09-25 (×6): 40 mg via ORAL
  Filled 2021-09-20 (×6): qty 1

## 2021-09-20 MED ORDER — METRONIDAZOLE 500 MG/100ML IV SOLN
500.0000 mg | Freq: Once | INTRAVENOUS | Status: AC
  Administered 2021-09-20: 500 mg via INTRAVENOUS
  Filled 2021-09-20: qty 100

## 2021-09-20 MED ORDER — METRONIDAZOLE 500 MG/100ML IV SOLN
500.0000 mg | Freq: Two times a day (BID) | INTRAVENOUS | Status: DC
Start: 2021-09-20 — End: 2021-09-24
  Administered 2021-09-20 – 2021-09-23 (×7): 500 mg via INTRAVENOUS
  Filled 2021-09-20 (×9): qty 100

## 2021-09-20 MED ORDER — DOCUSATE SODIUM 100 MG PO CAPS: 100.0000 mg | ORAL_CAPSULE | Freq: Two times a day (BID) | ORAL | Status: DC | PRN

## 2021-09-20 MED ORDER — SODIUM CHLORIDE 0.9 % IV BOLUS
1000.0000 mL | Freq: Once | INTRAVENOUS | Status: AC
  Administered 2021-09-20: 1000 mL via INTRAVENOUS

## 2021-09-20 MED ORDER — TRAMADOL HCL 50 MG PO TABS
50.0000 mg | ORAL_TABLET | Freq: Four times a day (QID) | ORAL | Status: DC | PRN
Start: 2021-09-20 — End: 2021-09-25

## 2021-09-20 MED ORDER — AMLODIPINE BESYLATE 5 MG PO TABS
2.5000 mg | ORAL_TABLET | Freq: Every day | ORAL | Status: DC
  Administered 2021-09-20 – 2021-09-25 (×6): 2.5 mg via ORAL
  Filled 2021-09-20 (×3): qty 0.5
  Filled 2021-09-20: qty 1
  Filled 2021-09-20 (×2): qty 0.5

## 2021-09-20 MED ORDER — CIPROFLOXACIN IN D5W 400 MG/200ML IV SOLN
400.0000 mg | Freq: Once | INTRAVENOUS | Status: AC
  Administered 2021-09-20: 400 mg via INTRAVENOUS
  Filled 2021-09-20: qty 200

## 2021-09-20 MED ORDER — ONDANSETRON HCL 4 MG/2ML IJ SOLN: 4.0000 mg | Freq: Four times a day (QID) | INTRAMUSCULAR | Status: DC | PRN

## 2021-09-20 MED ORDER — MORPHINE SULFATE (PF) 2 MG/ML IV SOLN: 2.0000 mg | INTRAVENOUS | Status: DC | PRN

## 2021-09-20 MED ORDER — HYDROCHLOROTHIAZIDE 25 MG PO TABS
25.0000 mg | ORAL_TABLET | Freq: Every day | ORAL | Status: DC
  Administered 2021-09-21 – 2021-09-25 (×5): 25 mg via ORAL
  Filled 2021-09-20 (×5): qty 1

## 2021-09-20 MED ORDER — ONDANSETRON HCL 4 MG/2ML IJ SOLN
4.0000 mg | Freq: Once | INTRAMUSCULAR | Status: AC
  Administered 2021-09-20: 4 mg via INTRAVENOUS
  Filled 2021-09-20: qty 2

## 2021-09-20 MED ORDER — SODIUM CHLORIDE 0.9 % IV SOLN
2.0000 g | Freq: Three times a day (TID) | INTRAVENOUS | Status: DC
  Administered 2021-09-20 – 2021-09-23 (×11): 2 g via INTRAVENOUS
  Filled 2021-09-20 (×13): qty 2

## 2021-09-20 MED ORDER — ONDANSETRON 4 MG PO TBDP: 4.0000 mg | ORAL_TABLET | Freq: Four times a day (QID) | ORAL | Status: DC | PRN

## 2021-09-20 MED ORDER — HYDROCODONE-ACETAMINOPHEN 5-325 MG PO TABS
1.0000 | ORAL_TABLET | ORAL | Status: DC | PRN
  Administered 2021-09-20 – 2021-09-21 (×6): 1 via ORAL
  Administered 2021-09-22: 2 via ORAL
  Administered 2021-09-22: 1 via ORAL
  Administered 2021-09-22 – 2021-09-24 (×5): 2 via ORAL
  Administered 2021-09-25: 1 via ORAL
  Filled 2021-09-20: qty 1
  Filled 2021-09-20: qty 2
  Filled 2021-09-20 (×2): qty 1
  Filled 2021-09-20 (×2): qty 2
  Filled 2021-09-20: qty 1
  Filled 2021-09-20 (×2): qty 2
  Filled 2021-09-20 (×2): qty 1
  Filled 2021-09-20 (×2): qty 2
  Filled 2021-09-20: qty 1

## 2021-09-20 MED ORDER — SODIUM CHLORIDE 0.9 % IV SOLN: INTRAVENOUS | Status: DC | PRN

## 2021-09-20 MED ORDER — IRBESARTAN 150 MG PO TABS
300.0000 mg | ORAL_TABLET | Freq: Every day | ORAL | Status: DC
  Administered 2021-09-21 – 2021-09-25 (×5): 300 mg via ORAL
  Filled 2021-09-20 (×6): qty 2

## 2021-09-20 MED ORDER — ENOXAPARIN SODIUM 40 MG/0.4ML IJ SOSY
40.0000 mg | PREFILLED_SYRINGE | INTRAMUSCULAR | Status: DC
  Administered 2021-09-20 – 2021-09-24 (×5): 40 mg via SUBCUTANEOUS
  Filled 2021-09-20 (×5): qty 0.4

## 2021-09-20 MED ORDER — VALSARTAN-HYDROCHLOROTHIAZIDE 320-25 MG PO TABS: 1.0000 | ORAL_TABLET | Freq: Every day | ORAL | Status: DC

## 2021-09-20 MED ORDER — MORPHINE SULFATE (PF) 4 MG/ML IV SOLN
4.0000 mg | Freq: Once | INTRAVENOUS | Status: AC
  Administered 2021-09-20: 4 mg via INTRAVENOUS
  Filled 2021-09-20: qty 1

## 2021-09-20 NOTE — Progress Notes (Signed)
Erroneous entry

## 2021-09-20 NOTE — Consult Note (Signed)
Urology Consult  I have been asked to see the patient by Dr. Tonna Boehringer, for evaluation and management of possible colovesical fistula.  Chief Complaint: Abdominal pain/dysuria  History of Present Illness: Adrienne Adkins is a 55 y.o. year old female with a personal history of diverticulitis admitted to general surgery service with acute diverticulitis and concerns for possible colovesical fistula on CT.  She reports her pain started yesterday.  She also began to experience urinary frequency and dysuria around the same time.  CT scan was reviewed, there does appear to be possible bladder involvement adjacent to the diseased bowel near the dome.  Urinalysis suspicious for infection.  She is currently n.p.o. being managed medically at this time.  Notably, she was admitted a few months ago with lower GI bleed.  She is scheduled to have colonoscopy few weeks ago but was unable to complete this due to inadequate prep.  This has been rescheduled.  No personal history of recurrent or frequent UTIs or any bladder issues in the past.  Past Medical History:  Diagnosis Date   Carpal tunnel syndrome    COVID-19 06/2019   Dermatitis    Diverticular disease    GERD (gastroesophageal reflux disease)    History of kidney stones    Hypertension    IGT (impaired glucose tolerance)    Lumbago    Migraines    Palpitations    Vertigo     Past Surgical History:  Procedure Laterality Date   ABDOMINAL HYSTERECTOMY     APPENDECTOMY     COLONOSCOPY     INCISION AND DRAINAGE ABSCESS Left 02/02/2021   Procedure: INCISION AND DRAINAGE ABSCESS;  Surgeon: Sung Amabile, DO;  Location: [facility] ORS;  Service: General;  Laterality: Left;  IODOFORM PACKING    INDUCED ABORTION     OOPHORECTOMY      Home Medications:  Current Meds  Medication Sig   meloxicam (MOBIC) 15 MG tablet Take 15 mg by mouth daily.   pantoprazole (PROTONIX) 40 MG tablet Take 1 tablet by mouth daily.   SUTAB [id number] MG TABS  Take by mouth.    Allergies:  Allergies  Allergen Reactions   Beta Adrenergic Blockers Other (See Comments)    Prolonged P waves on EKG   Sulfa Antibiotics Hives   Amoxicillin Hives and Rash    Family History  Problem Relation Age of Onset   Breast cancer Maternal Aunt     Social History:  reports that she has been smoking cigarettes. She has never used smokeless tobacco. She reports current alcohol use. She reports that she does not use drugs.  ROS: A complete review of systems was performed.  All systems are negative except for pertinent findings as noted.  Physical Exam:  Vital signs in last 24 hours: Temp:  [98.2 F (36.8 C)-99 F (37.2 C)] 98.2 F (36.8 C) (10/26 0647) Pulse Rate:  [66-95] 68 (10/26 1030) Resp:  [14-22] 16 (10/26 0932) BP: (96-126)/(71-87) 116/87 (10/26 1136) SpO2:  [95 %-100 %] 95 % (10/26 1030) Weight:  [81.6 kg] 81.6 kg (10/25 2145) Constitutional:  Alert and oriented, No acute distress HEENT: [place] AT, moist mucus membranes.  Trachea midline, no masses Neurologic: Grossly intact, no focal deficits, moving all 4 extremities Psychiatric: Normal mood and affect   Laboratory Data:  Recent Labs    09/19/21 2138  WBC 11.9*  HGB 9.6*  HCT 29.5*   Recent Labs    09/19/21 2138  NA 135  K  3.4*  CL 102  CO2 25  GLUCOSE 123*  BUN 14  CREATININE 0.50  CALCIUM 8.7*   Component     Latest Ref Rng & Units 09/19/2021  Color, Urine     YELLOW YELLOW (A)  Appearance     CLEAR HAZY (A)  Specific Gravity, Urine     1.005 - 1.030 1.014  pH     5.0 - 8.0 6.0  Glucose, UA     NEGATIVE mg/dL NEGATIVE  Hgb urine dipstick     NEGATIVE NEGATIVE  Bilirubin Urine     NEGATIVE NEGATIVE  Ketones, ur     NEGATIVE mg/dL NEGATIVE  Protein     NEGATIVE mg/dL NEGATIVE  Nitrite     NEGATIVE NEGATIVE  Leukocytes,UA     NEGATIVE   RBC / HPF     0 - 5 RBC/hpf 0-5  WBC, UA     0 - 5 WBC/hpf 6-10  Bacteria, UA     NONE SEEN NONE SEEN  Squamous  Epithelial / LPF     0 - 5 6-10  Leukocytes,Ua     NEGATIVE LARGE (A)      Radiologic Imaging: CT ABDOMEN PELVIS WO CONTRAST  Result Date: 09/20/2021 CLINICAL DATA:  55 year old female with history of mid abdominal pain. EXAM: CT ABDOMEN AND PELVIS WITHOUT CONTRAST TECHNIQUE: Multidetector CT imaging of the abdomen and pelvis was performed following the standard protocol without IV contrast. COMPARISON:  CTA of the abdomen and pelvis 07/03/2021. FINDINGS: Lower chest: Atherosclerotic calcifications in the right coronary artery. Hepatobiliary: No definite suspicious cystic or solid hepatic lesions are confidently identified on today's noncontrast CT examination. Unenhanced appearance of the gallbladder is normal. Pancreas: No definite pancreatic mass or peripancreatic fluid collections or inflammatory changes are noted on today's noncontrast CT examination. Spleen: Unremarkable. Adrenals/Urinary Tract: There are no abnormal calcifications within the collecting system of either kidney, along the course of either ureter, or within the lumen of the urinary bladder. No hydroureteronephrosis or perinephric stranding to suggest urinary tract obstruction at this time. The unenhanced appearance of the kidneys is unremarkable bilaterally. The superolateral aspect of the urinary bladder on the left side is markedly thickened measuring up to approximately 3.2 cm (coronal image 53 of series 5). There are extensive surrounding inflammatory changes in the perivesical fat, and there appears to be some tethering of the superolateral wall of the left side of the urinary bladder with the adjacent sigmoid colon where there are numerous colonic diverticuli. A small amount of gas in this area of tethering is noted, which appears extraluminal to the colon, and appears to be intramural within the thickened portion of the urinary bladder wall, suggesting a developing colovesical fistula. Bilateral adrenal glands are normal in  appearance. Stomach/Bowel: The unenhanced appearance of the stomach is normal. There is no pathologic dilatation of small bowel or colon. Numerous colonic diverticulae are noted, particularly in the sigmoid colon where there are surrounding inflammatory changes and findings associated with the urinary bladder (discussed above), concerning for potential developing colovesical fistula. The appendix is not confidently identified and may be surgically absent. Regardless, there are no inflammatory changes noted adjacent to the cecum to suggest the presence of an acute appendicitis at this time. Vascular/Lymphatic: Aortic atherosclerosis. No lymphadenopathy noted in the abdomen or pelvis. Reproductive: Status post hysterectomy. Ovaries are not confidently identified may be surgically absent or atrophic. Other: Trace volume of ascites in the low anatomic pelvis adjacent to the inflamed to urinary bladder. No  larger volume of ascites. No definite pneumoperitoneum. Musculoskeletal: There are no aggressive appearing lytic or blastic lesions noted in the visualized portions of the skeleton. IMPRESSION: 1. 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 Urologic consultation is strongly recommended. 2. Trace volume of ascites. 3. Aortic atherosclerosis including right coronary artery disease. Please note that although the presence of coronary artery calcium documents the presence of coronary artery disease, the severity of this disease and any potential stenosis cannot be assessed on this non-gated CT examination. Assessment for potential risk factor modification, dietary therapy or pharmacologic  therapy may be warranted, if clinically indicated. 4. Additional incidental findings, as above. Electronically Signed   By: Trudie Reed M.D.   On: 09/20/2021 05:42    CT scan personally reviewed.  There is radiologic interpretation.  Impression/ Plan:  1.  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 CT scan imaging as well as patient presentation/labs indicative of probable fistulous connection between diseased bowel and left dome of bladder  Being managed conservatively now by general surgery without plans for acute intervention at this time  Would recommend outpatient cystoscopy to ensure that there is no underlying malignant component although not suspected  Urology will be available at the time of definitive surgery to assist with bladder closure if deemed necessary and or assistance with ureteral identification per surgeon preference  Urology will sign off, please page/ secure chat/ call with any questions or concerns  09/20/2021, 11:48 AM  Vanna Scotland,  MD

## 2021-09-20 NOTE — ED Provider Notes (Signed)
[facility] Emergency Department Provider Note  ____________________________________________  Time seen: Approximately 8:18 AM  I have reviewed the triage vital signs and the nursing notes.   HISTORY  Chief Complaint Abdominal Pain    HPI Adrienne Adkins is a 55 y.o. female with a history of GERD, hypertension, diverticulitis who comes ED complaining of left lower quadrant abdominal pain that started about 24 hours ago.  Gradual onset, worsening, now severe.  Worse with movement, no alleviating factors.  Nonradiating.  No nausea vomiting diarrhea or constipation.  No black or bloody stool.    Past Medical History:  Diagnosis Date  . Carpal tunnel syndrome   . COVID-19 06/2019  . Dermatitis   . Diverticular disease   . GERD (gastroesophageal reflux disease)   . History of kidney stones   . Hypertension   . IGT (impaired glucose tolerance)   . Lumbago   . Migraines   . Palpitations   . Vertigo      Patient Active Problem List   Diagnosis Date Noted  . Obesity (BMI 30-39.9) 08/02/2021  . Acute blood loss anemia 07/04/2021  . Abdominal pain 07/04/2021  . Intra-abdominal abscess (HCC) 07/04/2021  . Diverticulitis 07/04/2021  . Acute prerenal azotemia 07/04/2021  . GERD (gastroesophageal reflux disease)   . Hypertension   . Acute lower GI bleeding 07/03/2021  . Gluteal abscess 02/02/2021  . IGT (impaired glucose tolerance) 11/27/2011     Past Surgical History:  Procedure Laterality Date  . ABDOMINAL HYSTERECTOMY    . APPENDECTOMY    . COLONOSCOPY    . INCISION AND DRAINAGE ABSCESS Left 02/02/2021   Procedure: INCISION AND DRAINAGE ABSCESS;  Surgeon: Sung Amabile, DO;  Location: [facility] ORS;  Service: General;  Laterality: Left;  IODOFORM PACKING   . INDUCED ABORTION    . OOPHORECTOMY       Prior to Admission medications   Medication Sig Start Date End Date Taking? Authorizing Provider  amLODipine (NORVASC) 2.5 MG tablet Take 2.5 mg by  mouth daily. 10/11/16   [provider]  pantoprazole (PROTONIX) 40 MG tablet Take 1 tablet (40 mg total) by mouth 2 (two) times daily. 07/05/21 08/04/21  Tresa Moore, MD  Sod Picosulfate-Mag Ox-Cit Acd (CLENPIQ) 10-3.5-12 MG-GM -GM/160ML SOLN Take 1 bottle at 5 PM followed by five 8 oz cups of water and repeat 5 hours before procedure. 08/02/21   Wyline Mood, MD  valsartan-hydrochlorothiazide (DIOVAN-HCT) 320-25 MG tablet Take 1 tablet by mouth daily.     [provider]     Allergies Beta adrenergic blockers, Sulfa antibiotics, and Amoxicillin   Family History  Problem Relation Age of Onset  . Breast cancer Maternal Aunt     Social History Social History   Tobacco Use  . Smoking status: Every Day    Types: Cigarettes  . Smokeless tobacco: Never  Vaping Use  . Vaping Use: Never used  Substance Use Topics  . Alcohol use: Yes    Comment: social  . Drug use: No    Review of Systems  Constitutional:   No fever or chills.  ENT:   No sore throat. No rhinorrhea. Cardiovascular:   No chest pain or syncope. Respiratory:   No dyspnea or cough. Gastrointestinal:   Positive for diffuse lower abdominal pain, no vomiting and diarrhea.  Musculoskeletal:   Negative for focal pain or swelling All other systems reviewed and are negative except as documented above in ROS and HPI.  ____________________________________________   PHYSICAL EXAM:  VITAL SIGNS: ED Triage Vitals  Enc Vitals Group     BP 09/19/21 2133 126/84     Pulse Rate 09/19/21 2133 95     Resp 09/19/21 2133 (!) 22     Temp 09/19/21 2133 99 F (37.2 C)     Temp Source 09/19/21 2133 Oral     SpO2 09/19/21 2133 98 %     Weight 09/19/21 2145 180 lb (81.6 kg)     Height 09/19/21 2145 5\' 1"  (1.549 m)     Head Circumference --      Peak Flow --      Pain Score 09/19/21 2144 10     Pain Loc --      Pain Edu? --      Excl. in GC? --     Vital signs reviewed, nursing assessments  reviewed.   Constitutional:   Alert and oriented. Non-toxic appearance. Eyes:   Conjunctivae are normal. EOMI. PERRL. ENT      Head:   Normocephalic and atraumatic.      Nose:   Wearing a mask.      Mouth/Throat:   Wearing a mask.      Neck:   No meningismus. Full ROM. Hematological/Lymphatic/Immunilogical:   No cervical lymphadenopathy. Cardiovascular:   RRR. Symmetric bilateral radial and DP pulses.  No murmurs. Cap refill less than 2 seconds. Respiratory:   Normal respiratory effort without tachypnea/retractions. Breath sounds are clear and equal bilaterally. No wheezes/rales/rhonchi. Gastrointestinal:   Soft with pronounced left lower quadrant tenderness. Non distended. No rebound, rigidity, or guarding.  Musculoskeletal:   Normal range of motion in all extremities. No joint effusions.  No lower extremity tenderness.  No edema. Neurologic:   Normal speech and language.  Motor grossly intact. No acute focal neurologic deficits are appreciated.  Skin:    Skin is warm, dry and intact. No rash noted.  No petechiae, purpura, or bullae.  ____________________________________________    LABS (pertinent positives/negatives) (all labs ordered are listed, but only abnormal results are displayed) Labs Reviewed  COMPREHENSIVE METABOLIC PANEL - Abnormal; Notable for the following components:      Result Value   Potassium 3.4 (*)    Glucose, Bld 123 (*)    Calcium 8.7 (*)    All other components within normal limits  CBC - Abnormal; Notable for the following components:   WBC 11.9 (*)    Hemoglobin 9.6 (*)    HCT 29.5 (*)    MCV 72.0 (*)    MCH 23.4 (*)    RDW 18.0 (*)    Platelets 466 (*)    All other components within normal limits  URINALYSIS, ROUTINE W REFLEX MICROSCOPIC - Abnormal; Notable for the following components:   Color, Urine YELLOW (*)    APPearance HAZY (*)    Leukocytes,Ua LARGE (*)    All other components within normal limits  RESP PANEL BY RT-PCR (FLU A&B,  COVID) ARPGX2  LIPASE, BLOOD   ____________________________________________   EKG    ____________________________________________    RADIOLOGY  CT ABDOMEN PELVIS WO CONTRAST  Result Date: 09/20/2021 CLINICAL DATA:  55 year old female with history of mid abdominal pain. EXAM: CT ABDOMEN AND PELVIS WITHOUT CONTRAST TECHNIQUE: Multidetector CT imaging of the abdomen and pelvis was performed following the standard protocol without IV contrast. COMPARISON:  CTA of the abdomen and pelvis 07/03/2021. FINDINGS: Lower chest: Atherosclerotic calcifications in the right coronary artery. Hepatobiliary: No definite suspicious cystic or solid hepatic lesions are confidently identified on today's  noncontrast CT examination. Unenhanced appearance of the gallbladder is normal. Pancreas: No definite pancreatic mass or peripancreatic fluid collections or inflammatory changes are noted on today's noncontrast CT examination. Spleen: Unremarkable. Adrenals/Urinary Tract: There are no abnormal calcifications within the collecting system of either kidney, along the course of either ureter, or within the lumen of the urinary bladder. No hydroureteronephrosis or perinephric stranding to suggest urinary tract obstruction at this time. The unenhanced appearance of the kidneys is unremarkable bilaterally. The superolateral aspect of the urinary bladder on the left side is markedly thickened measuring up to approximately 3.2 cm (coronal image 53 of series 5). There are extensive surrounding inflammatory changes in the perivesical fat, and there appears to be some tethering of the superolateral wall of the left side of the urinary bladder with the adjacent sigmoid colon where there are numerous colonic diverticuli. A small amount of gas in this area of tethering is noted, which appears extraluminal to the colon, and appears to be intramural within the thickened portion of the urinary bladder wall, suggesting a developing  colovesical fistula. Bilateral adrenal glands are normal in appearance. Stomach/Bowel: The unenhanced appearance of the stomach is normal. There is no pathologic dilatation of small bowel or colon. Numerous colonic diverticulae are noted, particularly in the sigmoid colon where there are surrounding inflammatory changes and findings associated with the urinary bladder (discussed above), concerning for potential developing colovesical fistula. The appendix is not confidently identified and may be surgically absent. Regardless, there are no inflammatory changes noted adjacent to the cecum to suggest the presence of an acute appendicitis at this time. Vascular/Lymphatic: Aortic atherosclerosis. No lymphadenopathy noted in the abdomen or pelvis. Reproductive: Status post hysterectomy. Ovaries are not confidently identified may be surgically absent or atrophic. Other: Trace volume of ascites in the low anatomic pelvis adjacent to the inflamed to urinary bladder. No larger volume of ascites. No definite pneumoperitoneum. Musculoskeletal: There are no aggressive appearing lytic or blastic lesions noted in the visualized portions of the skeleton. IMPRESSION: 1. 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 Urologic consultation is strongly recommended. 2. Trace volume of ascites. 3. Aortic atherosclerosis including right coronary artery disease. Please note that although the presence of coronary artery calcium documents the presence of coronary artery disease, the severity of this disease and any potential stenosis cannot be assessed on this non-gated CT examination. Assessment for potential  risk factor modification, dietary therapy or pharmacologic therapy may be warranted, if clinically indicated. 4. Additional incidental findings, as above. Electronically Signed   By: Trudie Reed M.D.   On: 09/20/2021 05:42    ____________________________________________   PROCEDURES Procedures  ____________________________________________  DIFFERENTIAL DIAGNOSIS   Diverticulitis, bowel obstruction, intra-abdominal abscess, cystitis, ureterolithiasis  CLINICAL IMPRESSION / ASSESSMENT AND PLAN / ED COURSE  Medications ordered in the ED: Medications  ciprofloxacin (CIPRO) IVPB 400 mg (has no administration in time range)  metroNIDAZOLE (FLAGYL) IVPB 500 mg (has no administration in time range)  sodium chloride 0.9 % bolus 1,000 mL (has no administration in time range)  morphine 4 MG/ML injection 4 mg (has no administration in time range)  ondansetron (ZOFRAN) injection 4 mg (has no administration in time range)  Pertinent labs & imaging results that were available during my care of the patient were reviewed by me and considered in my medical decision making (see chart for details).  Adrienne Adkins was evaluated in Emergency Department on 09/20/2021 for the symptoms described in the history of present illness. She was evaluated in the context of the global COVID-19 pandemic, which necessitated consideration that the patient might be at risk for infection with the SARS-CoV-2 virus that causes COVID-19. Institutional protocols and algorithms that pertain to the evaluation of patients at risk for COVID-19 are in a state of rapid change based on information released by regulatory bodies including the CDC and federal and state organizations. These policies and algorithms were followed during the patient's care in the ED.   Patient presents with left lower quadrant pain and tenderness.  Most concerning for diverticulitis.  CT confirms diverticulitis, and shows complicating features including  evidence of developing fistulization of the sigmoid to the urinary bladder.  No frank abdominal abscess or pneumoperitoneum.  Abdominal exam is nonsurgical.  Vitals are normal and labs are normal and she is not septic.  Will need hospitalization for IV antibiotics and surgical consultation.  Clinical Course as of 09/20/21 0846  Wed Sep 20, 2021  0932 D/w Dr. Tonna Boehringer who will eval pt for admission.  [PS]    Clinical Course User Index [PS] Sharman Cheek, MD     ____________________________________________   FINAL CLINICAL IMPRESSION(S) / ED DIAGNOSES    Final diagnoses:  Diverticulitis of large intestine without bleeding, unspecified complication status     ED Discharge Orders     None       Portions of this note were generated with dragon dictation software. Dictation errors may occur despite best attempts at proofreading.    Sharman Cheek, MD 09/20/21 [phone]

## 2021-09-20 NOTE — ED Notes (Signed)
Urology at bedside at this time.

## 2021-09-20 NOTE — H&P (Addendum)
Subjective:   CC: Acute diverticulitis with colovesicular fistula  HPI:  Adrienne Adkins is a 55 y.o. female who was consulted by [facility] for issue above.  Symptoms were first noted 1 day ago. Pain is sharp, confined to the suprapubic, without radiation.  Associated with dysuria, exacerbated by nothing specific.     Past Medical History:  has a past medical history of Carpal tunnel syndrome, COVID-19 (06/2019), Dermatitis, Diverticular disease, GERD (gastroesophageal reflux disease), History of kidney stones, Hypertension, IGT (impaired glucose tolerance), Lumbago, Migraines, Palpitations, and Vertigo.  Past Surgical History:  Past Surgical History:  Procedure Laterality Date   ABDOMINAL HYSTERECTOMY     APPENDECTOMY     COLONOSCOPY     INCISION AND DRAINAGE ABSCESS Left 02/02/2021   Procedure: INCISION AND DRAINAGE ABSCESS;  Surgeon: Sung Amabile, DO;  Location: [facility] ORS;  Service: General;  Laterality: Left;  IODOFORM PACKING    INDUCED ABORTION     OOPHORECTOMY      Family History: family history includes Breast cancer in her maternal aunt.  Social History:  reports that she has been smoking cigarettes. She has never used smokeless tobacco. She reports current alcohol use. She reports that she does not use drugs.  Current Medications:  Prior to Admission medications   Medication Sig Start Date End Date Taking? Authorizing Provider  amLODipine (NORVASC) 2.5 MG tablet Take 2.5 mg by mouth daily. 10/11/16   [provider]  pantoprazole (PROTONIX) 40 MG tablet Take 1 tablet (40 mg total) by mouth 2 (two) times daily. 07/05/21 08/04/21  Tresa Moore, MD  Sod Picosulfate-Mag Ox-Cit Acd (CLENPIQ) 10-3.5-12 MG-GM -GM/160ML SOLN Take 1 bottle at 5 PM followed by five 8 oz cups of water and repeat 5 hours before procedure. 08/02/21   Wyline Mood, MD  valsartan-hydrochlorothiazide (DIOVAN-HCT) 320-25 MG tablet Take 1 tablet by mouth daily.     [provider]     Allergies:  Allergies as of 09/19/2021 - Review Complete 09/19/2021  Allergen Reaction Noted   Beta adrenergic blockers Other (See Comments) 02/23/2016   Sulfa antibiotics Hives 02/01/2021   Amoxicillin Hives and Rash 02/23/2016    ROS:  General: Denies weight loss, weight gain, fatigue, fevers, chills, and night sweats. Eyes: Denies blurry vision, double vision, eye pain, itchy eyes, and tearing. Ears: Denies hearing loss, earache, and ringing in ears. Nose: Denies sinus pain, congestion, infections, runny nose, and nosebleeds. Mouth/throat: Denies hoarseness, sore throat, bleeding gums, and difficulty swallowing. Heart: Denies chest pain, palpitations, racing heart, irregular heartbeat, leg pain or swelling, and decreased activity tolerance. Respiratory: Denies breathing difficulty, shortness of breath, wheezing, cough, and sputum. GI: Denies change in appetite, heartburn, nausea, vomiting, constipation, diarrhea, and blood in stool. GU: Denies difficulty urinating, pain with urinating, urgency, frequency, blood in urine. Musculoskeletal: Denies joint stiffness, pain, swelling, muscle weakness. Skin: Denies rash, itching, mass, tumors, sores, and boils Neurologic: Denies headache, fainting, dizziness, seizures, numbness, and tingling. Psychiatric: Denies depression, anxiety, difficulty sleeping, and memory loss. Endocrine: Denies heat or cold intolerance, and increased thirst or urination. Blood/lymph: Denies easy bruising, easy bruising, and swollen glands     Objective:     BP 115/77   Pulse 67   Temp 98.2 F (36.8 C) (Oral)   Resp 16   Ht 5\' 1"  (1.549 m)   Wt 81.6 kg   SpO2 95%   BMI 34.01 kg/m   Constitutional :  alert, cooperative, appears stated age, and no distress  Lymphatics/Throat:  no asymmetry, masses,  or scars  Respiratory:  clear to auscultation bilaterally  Cardiovascular:  regular rate and rhythm  Gastrointestinal: Soft, no guarding, focal moderate  tenderness to palpation in left lower quadrant .   Musculoskeletal: Steady movement  Skin: Cool and moist, visible surgical scars   Psychiatric: Normal affect, non-agitated, not confused       LABS:  CMP Latest Ref Rng & Units 09/19/2021 07/05/2021 07/04/2021  Glucose 70 - 99 mg/dL 673(A) 95 93  BUN 6 - 20 mg/dL 14 15 13   Creatinine 0.44 - 1.00 mg/dL 1.93 7.90  Sodium 135 - 145 mmol/L 135 140 140  Potassium 3.5 - 5.1 mmol/L 3.4(L) 3.9 3.3(L)  Chloride 98 - 111 mmol/L 102 103 106  CO2 22 - 32 mmol/L 25 29 28   Calcium 8.9 - 10.3 mg/dL 2.40) 9.1 )  Total Protein 6.5 - 8.1 g/dL 7.8 - 6.3(L)  Total Bilirubin 0.3 - 1.2 mg/dL 0.8 - 0.7  Alkaline Phos 38 - 126 U/L 67 - 50  AST 15 - 41 U/L 16 - 18  ALT 0 - 44 U/L 13 - 13   CBC Latest Ref Rng & Units 09/19/2021 07/05/2021 07/04/2021  WBC 4.0 - 10.5 K/uL 11.9(H) 5.4 -  Hemoglobin 12.0 - 15.0 g/dL 09/04/2021) 11.7(L) 11.7(L)  Hematocrit 36.0 - 46.0 % 29.5(L) 33.7(L) 33.9(L)  Platelets 150 - 400 K/uL 466(H) 229 -    RADS: CLINICAL DATA:  55 year old female with history of mid abdominal pain.   EXAM: CT ABDOMEN AND PELVIS WITHOUT CONTRAST   TECHNIQUE: Multidetector CT imaging of the abdomen and pelvis was performed following the standard protocol without IV contrast.   COMPARISON:  CTA of the abdomen and pelvis 07/03/2021.   FINDINGS: Lower chest: Atherosclerotic calcifications in the right coronary artery.   Hepatobiliary: No definite suspicious cystic or solid hepatic lesions are confidently identified on today's noncontrast CT examination. Unenhanced appearance of the gallbladder is normal.   Pancreas: No definite pancreatic mass or peripancreatic fluid collections or inflammatory changes are noted on today's noncontrast CT examination.   Spleen: Unremarkable.   Adrenals/Urinary Tract: There are no abnormal calcifications within the collecting system of either kidney, along the course of either ureter, or within the  lumen of the urinary bladder. No hydroureteronephrosis or perinephric stranding to suggest urinary tract obstruction at this time. The unenhanced appearance of the kidneys is unremarkable bilaterally. The superolateral aspect of the urinary bladder on the left side is markedly thickened measuring up to approximately 3.2 cm (coronal image 53 of series 5). There are extensive surrounding inflammatory changes in the perivesical fat, and there appears to be some tethering of the superolateral wall of the left side of the urinary bladder with the adjacent sigmoid colon where there are numerous colonic diverticuli. A small amount of gas in this area of tethering is noted, which appears extraluminal to the colon, and appears to be intramural within the thickened portion of the urinary bladder wall, suggesting a developing colovesical fistula. Bilateral adrenal glands are normal in appearance.   Stomach/Bowel: The unenhanced appearance of the stomach is normal. There is no pathologic dilatation of small bowel or colon. Numerous colonic diverticulae are noted, particularly in the sigmoid colon where there are surrounding inflammatory changes and findings associated with the urinary bladder (discussed above), concerning for potential developing colovesical fistula. The appendix is not confidently identified and may be surgically absent. Regardless, there are no inflammatory changes noted adjacent to the cecum to suggest the presence of an acute appendicitis  at this time.   Vascular/Lymphatic: Aortic atherosclerosis. No lymphadenopathy noted in the abdomen or pelvis.   Reproductive: Status post hysterectomy. Ovaries are not confidently identified may be surgically absent or atrophic.   Other: Trace volume of ascites in the low anatomic pelvis adjacent to the inflamed to urinary bladder. No larger volume of ascites. No definite pneumoperitoneum.   Musculoskeletal: There are no aggressive  appearing lytic or blastic lesions noted in the visualized portions of the skeleton.   IMPRESSION: 1. 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 Urologic consultation is strongly recommended. 2. Trace volume of ascites. 3. Aortic atherosclerosis including right coronary artery disease. Please note that although the presence of coronary artery calcium documents the presence of coronary artery disease, the severity of this disease and any potential stenosis cannot be assessed on this non-gated CT examination. Assessment for potential risk factor modification, dietary therapy or pharmacologic therapy may be warranted, if clinically indicated. 4. Additional incidental findings, as above.     Electronically Signed   By: Trudie Reed M.D.   On: 09/20/2021 05:42 Assessment:   Acute recurrent diverticulitis, with concern for possible colovesicular fistula.  Plan:   Due to acute nature of this episode, will try IV antibiotics and bowel rest to see if symptomatically can improve for interval colonoscopy and likely surgical resection of the area in the future.  While waiting for this acute episode to resolve, hope is that we can avoid a need for an ostomy.  We did briefly discussed the natural course of diverticulitis and plan above.  Patient verbalized understanding and is agreeable to admission with IV fluid fluids, IV antibiotics, n.p.o. for this today.  Urology consulted and they think there should be no urgent need for surgical intervention on their end for now.

## 2021-09-21 LAB — CBC
HCT: 28 % — ABNORMAL LOW (ref 36.0–46.0)
Hemoglobin: 9 g/dL — ABNORMAL LOW (ref 12.0–15.0)
MCH: 23.9 pg — ABNORMAL LOW (ref 26.0–34.0)
MCHC: 32.1 g/dL (ref 30.0–36.0)
MCV: 74.5 fL — ABNORMAL LOW (ref 80.0–100.0)
Platelets: 423 10*3/uL — ABNORMAL HIGH (ref 150–400)
RBC: 3.76 MIL/uL — ABNORMAL LOW (ref 3.87–5.11)
RDW: 17.9 % — ABNORMAL HIGH (ref 11.5–15.5)
WBC: 7.6 10*3/uL (ref 4.0–10.5)
nRBC: 0 % (ref 0.0–0.2)

## 2021-09-21 LAB — BASIC METABOLIC PANEL
Anion gap: 7 (ref 5–15)
BUN: 12 mg/dL (ref 6–20)
CO2: 27 mmol/L (ref 22–32)
Calcium: 8.8 mg/dL — ABNORMAL LOW (ref 8.9–10.3)
Chloride: 105 mmol/L (ref 98–111)
Creatinine, Ser: 0.65 mg/dL (ref 0.44–1.00)
GFR, Estimated: >60 mL/min (ref >60)
Glucose, Bld: 86 mg/dL (ref 70–99)
Potassium: 4 mmol/L (ref 3.5–5.1)
Sodium: 139 mmol/L (ref 135–145)

## 2021-09-21 LAB — MAGNESIUM: Magnesium: 2.3 mg/dL (ref 1.7–2.4)

## 2021-09-21 LAB — PHOSPHORUS: Phosphorus: 4.1 mg/dL (ref 2.5–4.6)

## 2021-09-21 NOTE — Progress Notes (Signed)
Subjective:  CC: Adrienne Adkins is a 55 y.o. female  Hospital stay day 1,   diverticulitis and colovesicular fistula  HPI: No acute issues.  Pain is slightly improved.  Still hurts to urinate.  ROS:  General: Denies weight loss, weight gain, fatigue, fevers, chills, and night sweats. Heart: Denies chest pain, palpitations, racing heart, irregular heartbeat, leg pain or swelling, and decreased activity tolerance. Respiratory: Denies breathing difficulty, shortness of breath, wheezing, cough, and sputum. GI: Denies change in appetite, heartburn, nausea, vomiting, constipation, diarrhea, and blood in stool. GU: Denies difficulty urinating, pain with urinating, urgency, frequency, blood in urine.   Objective:   Temp:  [98.2 F (36.8 C)-99.6 F (37.6 C)] 98.7 F (37.1 C) (10/27 1139) Pulse Rate:  [68-83] 78 (10/27 1139) Resp:  [14-20] 20 (10/27 1139) BP: (98-129)/(61-86) 102/80 (10/27 1139) SpO2:  [96 %-100 %] 97 % (10/27 1139)     Height: 5\' 1"  (154.9 cm) Weight: 81.6 kg BMI (Calculated): 34.03   Intake/Output this shift:   Intake/Output Summary (Last 24 hours) at 09/21/2021 1222 Last data filed at 09/21/2021 0900 Gross per 24 hour  Intake 324.79 ml  Output 800 ml  Net -475.21 ml    Constitutional :  alert, cooperative, appears stated age, and no distress  Respiratory:  clear to auscultation bilaterally  Cardiovascular:  regular rate and rhythm  Gastrointestinal: Soft, focal moderate TTP in LLQ and suprapubic, mostly unchanged from previous exam .   Skin: Cool and moist. Midline infraumbilical incision  Psychiatric: Normal affect, non-agitated, not confused       LABS:  CMP Latest Ref Rng & Units 09/21/2021 09/20/2021 09/19/2021  Glucose 70 - 99 mg/dL 86 - 09/21/2021)  BUN 6 - 20 mg/dL 12 - 14  Creatinine 655(V - 1.00 mg/dL 7.48 2.70) 7.86(L  Sodium 135 - 145 mmol/L 139 - 135  Potassium 3.5 - 5.1 mmol/L 4.0 - 3.4(L)  Chloride 98 - 111 mmol/L 105 - 102  CO2 22 - 32 mmol/L 27 -  25  Calcium 8.9 - 10.3 mg/dL 5.44) - 8.7(L)  Total Protein 6.5 - 8.1 g/dL - - 7.8  Total Bilirubin 0.3 - 1.2 mg/dL - - 0.8  Alkaline Phos 38 - 126 U/L - - 67  AST 15 - 41 U/L - - 16  ALT 0 - 44 U/L - - 13   CBC Latest Ref Rng & Units 09/21/2021 09/20/2021 09/19/2021  WBC 4.0 - 10.5 K/uL 7.6 8.3 11.9(H)  Hemoglobin 12.0 - 15.0 g/dL 9.0(L) 8.7(L) 9.6(L)  Hematocrit 36.0 - 46.0 % 28.0(L) 28.0(L) 29.5(L)  Platelets 150 - 400 K/uL 423(H) 410(H) 466(H)    RADS: N/a Assessment:   diverticulitis and colovesicular fistula.  Continue IV abx.  Start clears for comfort.

## 2021-09-22 LAB — MAGNESIUM: Magnesium: 2.1 mg/dL (ref 1.7–2.4)

## 2021-09-22 LAB — CBC
HCT: 26.3 % — ABNORMAL LOW (ref 36.0–46.0)
Hemoglobin: 8.6 g/dL — ABNORMAL LOW (ref 12.0–15.0)
MCH: 23.8 pg — ABNORMAL LOW (ref 26.0–34.0)
MCHC: 32.7 g/dL (ref 30.0–36.0)
MCV: 72.7 fL — ABNORMAL LOW (ref 80.0–100.0)
Platelets: 435 10*3/uL — ABNORMAL HIGH (ref 150–400)
RBC: 3.62 MIL/uL — ABNORMAL LOW (ref 3.87–5.11)
RDW: 17.8 % — ABNORMAL HIGH (ref 11.5–15.5)
WBC: 6.1 10*3/uL (ref 4.0–10.5)
nRBC: 0 % (ref 0.0–0.2)

## 2021-09-22 LAB — BASIC METABOLIC PANEL
Anion gap: 5 (ref 5–15)
BUN: 10 mg/dL (ref 6–20)
CO2: 28 mmol/L (ref 22–32)
Calcium: 8.7 mg/dL — ABNORMAL LOW (ref 8.9–10.3)
Chloride: 102 mmol/L (ref 98–111)
Creatinine, Ser: 0.63 mg/dL (ref 0.44–1.00)
GFR, Estimated: >60 mL/min (ref >60)
Glucose, Bld: 86 mg/dL (ref 70–99)
Potassium: 3.5 mmol/L (ref 3.5–5.1)
Sodium: 135 mmol/L (ref 135–145)

## 2021-09-22 LAB — PHOSPHORUS: Phosphorus: 3.8 mg/dL (ref 2.5–4.6)

## 2021-09-22 NOTE — Progress Notes (Signed)
Subjective:  CC: Adrienne Adkins is a 55 y.o. female  Hospital stay day 2,   diverticulitis and colovesicular fistula  HPI: No acute issues.  Pain is slightly improved again, less so with urination now.  ROS:  General: Denies weight loss, weight gain, fatigue, fevers, chills, and night sweats. Heart: Denies chest pain, palpitations, racing heart, irregular heartbeat, leg pain or swelling, and decreased activity tolerance. Respiratory: Denies breathing difficulty, shortness of breath, wheezing, cough, and sputum. GI: Denies change in appetite, heartburn, nausea, vomiting, constipation, diarrhea, and blood in stool. GU: Denies difficulty urinating, pain with urinating, urgency, frequency, blood in urine.   Objective:   Temp:  [97.4 F (36.3 C)-99.2 F (37.3 C)] 98.1 F (36.7 C) (10/28 1154) Pulse Rate:  [59-73] 59 (10/28 1154) Resp:  [18-20] 18 (10/28 1154) BP: (104-127)/(72-87) 122/74 (10/28 1154) SpO2:  [96 %-100 %] 97 % (10/28 1154)     Height: 5\' 1"  (154.9 cm) Weight: 81.6 kg BMI (Calculated): 34.03   Intake/Output this shift:   Intake/Output Summary (Last 24 hours) at 09/22/2021 1328 Last data filed at 09/22/2021 0230 Gross per 24 hour  Intake 568.14 ml  Output 1100 ml  Net -531.86 ml    Constitutional :  alert, cooperative, appears stated age, and no distress  Respiratory:  clear to auscultation bilaterally  Cardiovascular:  regular rate and rhythm  Gastrointestinal: Soft, focal moderate TTP in LLQ and suprapubic, still unchanged from previous exam .   Skin: Cool and moist. Midline infraumbilical incision  Psychiatric: Normal affect, non-agitated, not confused       LABS:  CMP Latest Ref Rng & Units 09/22/2021 09/21/2021 09/20/2021  Glucose 70 - 99 mg/dL 86 86 -  BUN 6 - 20 mg/dL 10 12 -  Creatinine 09/22/2021 - 1.00 mg/dL 9.14 7.82 9.56)  Sodium 135 - 145 mmol/L 135 139 -  Potassium 3.5 - 5.1 mmol/L 3.5 4.0 -  Chloride 98 - 111 mmol/L 102 105 -  CO2 22 - 32 mmol/L  28 27 -  Calcium 8.9 - 10.3 mg/dL 2.13(Y) 8.6(V) -  Total Protein 6.5 - 8.1 g/dL - - -  Total Bilirubin 0.3 - 1.2 mg/dL - - -  Alkaline Phos 38 - 126 U/L - - -  AST 15 - 41 U/L - - -  ALT 0 - 44 U/L - - -   CBC Latest Ref Rng & Units 09/22/2021 09/21/2021 09/20/2021  WBC 4.0 - 10.5 K/uL 6.1 7.6 8.3  Hemoglobin 12.0 - 15.0 g/dL 09/22/2021) 6.9(G) 2.9(B)  Hematocrit 36.0 - 46.0 % 26.3(L) 28.0(L) 28.0(L)  Platelets 150 - 400 K/uL 435(H) 423(H) 410(H)    RADS: N/a Assessment:   diverticulitis and colovesicular fistula.  Continue IV abx.  Start clears for comfort, ok to have coffee with cream for possible headache relief.  Monitor for improvement in pain on palpation prior to advancing diet.  If no improvement, may need to consider repeat CT in a few days.

## 2021-09-23 LAB — PHOSPHORUS: Phosphorus: 4.1 mg/dL (ref 2.5–4.6)

## 2021-09-23 LAB — CBC
HCT: 31.1 % — ABNORMAL LOW (ref 36.0–46.0)
Hemoglobin: 10.2 g/dL — ABNORMAL LOW (ref 12.0–15.0)
MCH: 23.7 pg — ABNORMAL LOW (ref 26.0–34.0)
MCHC: 32.8 g/dL (ref 30.0–36.0)
MCV: 72.2 fL — ABNORMAL LOW (ref 80.0–100.0)
Platelets: 493 10*3/uL — ABNORMAL HIGH (ref 150–400)
RBC: 4.31 MIL/uL (ref 3.87–5.11)
RDW: 17.5 % — ABNORMAL HIGH (ref 11.5–15.5)
WBC: 6.1 10*3/uL (ref 4.0–10.5)
nRBC: 0 % (ref 0.0–0.2)

## 2021-09-23 LAB — BASIC METABOLIC PANEL
Anion gap: 8 (ref 5–15)
BUN: 10 mg/dL (ref 6–20)
CO2: 26 mmol/L (ref 22–32)
Calcium: 9.2 mg/dL (ref 8.9–10.3)
Chloride: 102 mmol/L (ref 98–111)
Creatinine, Ser: 0.66 mg/dL (ref 0.44–1.00)
GFR, Estimated: >60 mL/min (ref >60)
Glucose, Bld: 85 mg/dL (ref 70–99)
Potassium: 3.7 mmol/L (ref 3.5–5.1)
Sodium: 136 mmol/L (ref 135–145)

## 2021-09-23 LAB — MAGNESIUM: Magnesium: 2.2 mg/dL (ref 1.7–2.4)

## 2021-09-23 MED ORDER — POLYETHYLENE GLYCOL 3350 17 G PO PACK
17.0000 g | PACK | Freq: Every day | ORAL | Status: DC
  Administered 2021-09-23 – 2021-09-25 (×3): 17 g via ORAL
  Filled 2021-09-23 (×3): qty 1

## 2021-09-23 NOTE — Progress Notes (Signed)
Patient ID: Adrienne Adkins, female   DOB: July 31, 1966, 55 y.o.   MRN: [id number]     SURGICAL PROGRESS NOTE   Hospital Day(s): 3.   Interval History: Patient seen and examined, no acute events or new complaints overnight. Patient reports her lower abdominal pain is slightly better.  She endorses having constipation.  Denies any nausea or vomiting.  Pain mainly on the suprapubic area.  No pain radiation.  Aggravating factor is applying pressure.  Alleviating factor is pain medications.  Vital signs in last 24 hours: [min-max] current  Temp:  [97.4 F (36.3 C)-98.7 F (37.1 C)] 98.4 F (36.9 C) (10/29 0735) Pulse Rate:  [59-73] 73 (10/29 0735) Resp:  [18-20] 18 (10/29 0735) BP: (109-137)/(69-91) 137/91 (10/29 0735) SpO2:  [97 %-100 %] 99 % (10/29 0735)     Height: 5\' 1"  (154.9 cm) Weight: 81.6 kg BMI (Calculated): 34.03   Physical Exam:  Constitutional: alert, cooperative and no distress  Respiratory: breathing non-labored at rest  Cardiovascular: regular rate and sinus rhythm  Gastrointestinal: soft, mild-tender, and non-distended  Labs:  CBC Latest Ref Rng & Units 09/23/2021 09/22/2021 09/21/2021  WBC 4.0 - 10.5 K/uL 6.1 6.1 7.6  Hemoglobin 12.0 - 15.0 g/dL 10.2(L) 8.6(L) 9.0(L)  Hematocrit 36.0 - 46.0 % 31.1(L) 26.3(L) 28.0(L)  Platelets 150 - 400 K/uL 493(H) 435(H) 423(H)   CMP Latest Ref Rng & Units 09/23/2021 09/22/2021 09/21/2021  Glucose 70 - 99 mg/dL 85 86 86  BUN 6 - 20 mg/dL 10 10 12   Creatinine 0.44 - 1.00 mg/dL 09/23/2021 4.35  Sodium 135 - 145 mmol/L 136 135 139  Potassium 3.5 - 5.1 mmol/L 3.7 3.5 4.0  Chloride 98 - 111 mmol/L 102 102 105  CO2 22 - 32 mmol/L 26 28 27   Calcium 8.9 - 10.3 mg/dL 9.2 6.86) 1.68)  Total Protein 6.5 - 8.1 g/dL - - -  Total Bilirubin 0.3 - 1.2 mg/dL - - -  Alkaline Phos 38 - 126 U/L - - -  AST 15 - 41 U/L - - -  ALT 0 - 44 U/L - - -    Imaging studies: No new pertinent imaging studies   Assessment/Plan:  55 y.o. female with acute  diverticulitis, complicated by pertinent comorbidities including hypertension, migraines.  Patient today seen with improvement of pain on palpation.  There has been no fever.  There is no leukocytosis.  Patient tolerating clear liquid diet.  Still feeling constipation.  Passing gas.  I will advance diet to full liquids and assess for toleration.  We will continue with IV antibiotic therapy.  I encouraged the patient to ambulate.  We will continue with DVT prophylaxis.  If patient continue with improvement of her abdominal pain and tolerating diet planning for discharge in 48 hours.  3.7(G, MD

## 2021-09-24 LAB — CBC
HCT: 29.9 % — ABNORMAL LOW (ref 36.0–46.0)
Hemoglobin: 9.9 g/dL — ABNORMAL LOW (ref 12.0–15.0)
MCH: 23.7 pg — ABNORMAL LOW (ref 26.0–34.0)
MCHC: 33.1 g/dL (ref 30.0–36.0)
MCV: 71.5 fL — ABNORMAL LOW (ref 80.0–100.0)
Platelets: 460 10*3/uL — ABNORMAL HIGH (ref 150–400)
RBC: 4.18 MIL/uL (ref 3.87–5.11)
RDW: 17.6 % — ABNORMAL HIGH (ref 11.5–15.5)
WBC: 4.8 10*3/uL (ref 4.0–10.5)
nRBC: 0 % (ref 0.0–0.2)

## 2021-09-24 LAB — BASIC METABOLIC PANEL
Anion gap: 8 (ref 5–15)
BUN: 9 mg/dL (ref 6–20)
CO2: 26 mmol/L (ref 22–32)
Calcium: 8.8 mg/dL — ABNORMAL LOW (ref 8.9–10.3)
Chloride: 103 mmol/L (ref 98–111)
Creatinine, Ser: 0.52 mg/dL (ref 0.44–1.00)
GFR, Estimated: >60 mL/min (ref >60)
Glucose, Bld: 92 mg/dL (ref 70–99)
Potassium: 3.6 mmol/L (ref 3.5–5.1)
Sodium: 137 mmol/L (ref 135–145)

## 2021-09-24 LAB — PHOSPHORUS: Phosphorus: 4.2 mg/dL (ref 2.5–4.6)

## 2021-09-24 LAB — MAGNESIUM: Magnesium: 2.4 mg/dL (ref 1.7–2.4)

## 2021-09-24 MED ORDER — METRONIDAZOLE 500 MG/100ML IV SOLN
500.0000 mg | Freq: Two times a day (BID) | INTRAVENOUS | Status: DC
  Administered 2021-09-24 – 2021-09-25 (×3): 500 mg via INTRAVENOUS
  Filled 2021-09-24 (×5): qty 100

## 2021-09-24 MED ORDER — SODIUM CHLORIDE 0.9 % IV SOLN
2.0000 g | Freq: Three times a day (TID) | INTRAVENOUS | Status: DC
  Administered 2021-09-24 – 2021-09-25 (×4): 2 g via INTRAVENOUS
  Filled 2021-09-24 (×7): qty 2

## 2021-09-24 NOTE — Progress Notes (Signed)
Pt ambulated twice around nurses station with standby assist. No complaints of pain, pt tolerated well.

## 2021-09-24 NOTE — Progress Notes (Signed)
Patient ID: Adrienne Adkins, female   DOB: Apr 13, 1966, 55 y.o.   MRN: [id number]     SURGICAL PROGRESS NOTE   Hospital Day(s): 4.   Interval History: Patient seen and examined, no acute events or new complaints overnight. Patient reports feeling better today.  She endorses that the mid lower abdominal pain is improving as well.  There has been mild sharp pain intermittently but mainly without pain.  She tolerated full liquid diet without pain.  Endorses passing gas.  No bowel movement.  Vital signs in last 24 hours: [min-max] current  Temp:  [97.9 F (36.6 C)-98.7 F (37.1 C)] 98.7 F (37.1 C) (10/30 0753) Pulse Rate:  [62-79] 63 (10/30 0753) Resp:  [15-18] 18 (10/30 0753) BP: (92-129)/(65-95) 126/80 (10/30 0753) SpO2:  [96 %-100 %] 96 % (10/30 0753)     Height: 5\' 1"  (154.9 cm) Weight: 81.6 kg BMI (Calculated): 34.03   Physical Exam:  Constitutional: alert, cooperative and no distress  Respiratory: breathing non-labored at rest  Cardiovascular: regular rate and sinus rhythm  Gastrointestinal: soft, non-tender, and non-distended  Labs:  CBC Latest Ref Rng & Units 09/24/2021 09/23/2021 09/22/2021  WBC 4.0 - 10.5 K/uL 4.8 6.1 6.1  Hemoglobin 12.0 - 15.0 g/dL 09/24/2021) 10.2(L) 8.6(L)  Hematocrit 36.0 - 46.0 % 29.9(L) 31.1(L) 26.3(L)  Platelets 150 - 400 K/uL 460(H) 493(H) 435(H)   CMP Latest Ref Rng & Units 09/24/2021 09/23/2021 09/22/2021  Glucose 70 - 99 mg/dL 92 85 86  BUN 6 - 20 mg/dL 9 10 10   Creatinine 0.44 - 1.00 mg/dL 09/24/2021 2.53  Sodium 135 - 145 mmol/L 137 136 135  Potassium 3.5 - 5.1 mmol/L 3.6 3.7 3.5  Chloride 98 - 111 mmol/L 103 102 102  CO2 22 - 32 mmol/L 26 26 28   Calcium 8.9 - 10.3 mg/dL 6.64) 9.2 4.03)  Total Protein 6.5 - 8.1 g/dL - - -  Total Bilirubin 0.3 - 1.2 mg/dL - - -  Alkaline Phos 38 - 126 U/L - - -  AST 15 - 41 U/L - - -  ALT 0 - 44 U/L - - -    Imaging studies: No new pertinent imaging studies   Assessment/Plan:  55 y.o. female with acute  diverticulitis, complicated by pertinent comorbidities including hypertension, migraines.  Patient continue with improved abdominal pain.  No pain on palpation.  Basically her pain is sharp pains that are intermittently but at this moment are controlled.  Patient tolerated a full liquid diet without pain.  I will advance her diet to soft diet.  We will continue with IV antibiotic therapy.  I encouraged the patient to ambulate.  4.7(Q, MD

## 2021-09-25 LAB — BASIC METABOLIC PANEL
Anion gap: 7 (ref 5–15)
BUN: 17 mg/dL (ref 6–20)
CO2: 28 mmol/L (ref 22–32)
Calcium: 8.9 mg/dL (ref 8.9–10.3)
Chloride: 103 mmol/L (ref 98–111)
Creatinine, Ser: 0.63 mg/dL (ref 0.44–1.00)
GFR, Estimated: >60 mL/min (ref >60)
Glucose, Bld: 92 mg/dL (ref 70–99)
Potassium: 3.6 mmol/L (ref 3.5–5.1)
Sodium: 138 mmol/L (ref 135–145)

## 2021-09-25 LAB — CBC
HCT: 29.8 % — ABNORMAL LOW (ref 36.0–46.0)
Hemoglobin: 9.6 g/dL — ABNORMAL LOW (ref 12.0–15.0)
MCH: 22.9 pg — ABNORMAL LOW (ref 26.0–34.0)
MCHC: 32.2 g/dL (ref 30.0–36.0)
MCV: 71.1 fL — ABNORMAL LOW (ref 80.0–100.0)
Platelets: 467 10*3/uL — ABNORMAL HIGH (ref 150–400)
RBC: 4.19 MIL/uL (ref 3.87–5.11)
RDW: 18 % — ABNORMAL HIGH (ref 11.5–15.5)
WBC: 5.4 10*3/uL (ref 4.0–10.5)
nRBC: 0 % (ref 0.0–0.2)

## 2021-09-25 LAB — MAGNESIUM: Magnesium: 2.1 mg/dL (ref 1.7–2.4)

## 2021-09-25 LAB — PHOSPHORUS: Phosphorus: 4.2 mg/dL (ref 2.5–4.6)

## 2021-09-25 MED ORDER — METRONIDAZOLE 500 MG PO TABS: 500.0000 mg | ORAL_TABLET | Freq: Two times a day (BID) | ORAL | 0 refills | Status: AC

## 2021-09-25 MED ORDER — CIPROFLOXACIN HCL 500 MG PO TABS: 500.0000 mg | ORAL_TABLET | Freq: Two times a day (BID) | ORAL | 0 refills | Status: AC

## 2021-09-25 MED ORDER — ACETAMINOPHEN 325 MG PO TABS: 650.0000 mg | ORAL_TABLET | Freq: Three times a day (TID) | ORAL | 0 refills | Status: AC | PRN

## 2021-09-25 MED ORDER — ACETAMINOPHEN 325 MG PO TABS: 650.0000 mg | ORAL_TABLET | Freq: Four times a day (QID) | ORAL | Status: DC | PRN

## 2021-09-25 MED ORDER — DOCUSATE SODIUM 100 MG PO CAPS: 100.0000 mg | ORAL_CAPSULE | Freq: Two times a day (BID) | ORAL | 0 refills | Status: AC | PRN

## 2021-09-25 MED ORDER — TRAMADOL HCL 50 MG PO TABS: 50.0000 mg | ORAL_TABLET | Freq: Four times a day (QID) | ORAL | 0 refills | Status: DC | PRN

## 2021-09-25 MED ORDER — TRAMADOL HCL 50 MG PO TABS
50.0000 mg | ORAL_TABLET | Freq: Four times a day (QID) | ORAL | Status: DC | PRN
  Administered 2021-09-25: 50 mg via ORAL
  Filled 2021-09-25: qty 1

## 2021-09-25 MED ORDER — DOCUSATE SODIUM 100 MG PO CAPS
100.0000 mg | ORAL_CAPSULE | Freq: Two times a day (BID) | ORAL | Status: DC
  Administered 2021-09-25: 100 mg via ORAL
  Filled 2021-09-25: qty 1

## 2021-09-25 NOTE — Discharge Instructions (Signed)
Take antibiotics and pain meds as prescribed No specific dietary or activity restrictions required Call office with any additional questions or concerns

## 2021-09-25 NOTE — Progress Notes (Signed)
Discharge instructions, prescriptions, education, and appointments given and explained. Pt verbalized understanding with no further questions. Pt wheeled to personal vehicle via staff for d/c.

## 2021-09-25 NOTE — Discharge Summary (Signed)
Physician Discharge Summary  Patient ID: Adrienne Adkins MRN: [id number] DOB/AGE: 56-Apr-1967 56 y.o.  Admit date: 09/20/2021 Discharge date: 09/25/21  Admission Diagnoses: diverticulitis  Discharge Diagnoses:  Same as above  Discharged Condition: good  Hospital Course: admitted for above. IV abx started.  Pain improved, diet resumed and tolerated well.  F/u two weeks in office to discuss followup colonoscopy plus possible elective sigmoid resection to prevent recurrence. Urology consulted for concern for colovesicular fistula from diverticulitis, but no immediate intervention needed.  Consults: urology  Discharge Exam: Blood pressure 121/86, pulse 71, temperature 98.8 F (37.1 C), temperature source Oral, resp. rate 18, height 5\' 1"  (1.549 m), weight 81.6 kg, SpO2 99 %. General appearance: alert, cooperative, and no distress GI: soft, no guarding, minimal TTP in LLQ and suprapubic region now  Disposition:  Discharge disposition: 01-Home or Self Care       Discharge Instructions     Discharge patient   Complete by: As directed    Discharge disposition: 01-Home or Self Care   Discharge patient date: 09/25/2021      Allergies as of 09/25/2021       Reactions   Beta Adrenergic Blockers Other (See Comments)   Prolonged P waves on EKG   Amoxicillin Hives, Rash   Sulfa Antibiotics Hives        Medication List     TAKE these medications    acetaminophen 325 MG tablet Commonly known as: Tylenol Take 2 tablets (650 mg total) by mouth every 8 (eight) hours as needed for mild pain.   amLODipine 2.5 MG tablet Commonly known as: NORVASC Take 2.5 mg by mouth daily.   ciprofloxacin 500 MG tablet Commonly known as: CIPRO Take 1 tablet (500 mg total) by mouth 2 (two) times daily for 5 days.   Clenpiq 10-3.5-12 MG-GM -GM/160ML Soln Generic drug: Sod Picosulfate-Mag Ox-Cit Acd Take 1 bottle at 5 PM followed by five 8 oz cups of water and repeat 5 hours before  procedure.   docusate sodium 100 MG capsule Commonly known as: Colace Take 1 capsule (100 mg total) by mouth 2 (two) times daily as needed for up to 10 days for mild constipation.   meloxicam 15 MG tablet Commonly known as: MOBIC Take 15 mg by mouth daily.   metroNIDAZOLE 500 MG tablet Commonly known as: Flagyl Take 1 tablet (500 mg total) by mouth 2 (two) times daily for 5 days.   pantoprazole 40 MG tablet Commonly known as: PROTONIX Take 1 tablet (40 mg total) by mouth 2 (two) times daily.   pantoprazole 40 MG tablet Commonly known as: PROTONIX Take 1 tablet by mouth daily.   Sutab [id number] MG Tabs Generic drug: Sodium Sulfate-Mag Sulfate-KCl Take by mouth.   traMADol 50 MG tablet Commonly known as: Ultram Take 1 tablet (50 mg total) by mouth every 6 (six) hours as needed.   valsartan-hydrochlorothiazide 320-25 MG tablet Commonly known as: DIOVAN-HCT Take 1 tablet by mouth daily.        Follow-up Information     [phone], Katilynn Sinkler, DO Follow up in 2 week(s).   Specialty: Surgery Why: diverticulitis Contact information: [address] [place] [phone]                  Total time spent arranging discharge was >63min. Signed: 31m 09/25/2021, 4:20 PM

## 2021-10-12 ENCOUNTER — Other Ambulatory Visit: Payer: Self-pay | Admitting: Urology

## 2021-10-17 ENCOUNTER — Other Ambulatory Visit: Payer: Self-pay | Admitting: Surgery

## 2021-10-17 DIAGNOSIS — K5732 Diverticulitis of large intestine without perforation or abscess without bleeding: Secondary | ICD-10-CM

## 2021-10-27 ENCOUNTER — Encounter: Payer: Self-pay | Admitting: Emergency Medicine

## 2021-10-27 ENCOUNTER — Inpatient Hospital Stay
Admission: EM | Admit: 2021-10-27 | Discharge: 2021-10-31 | DRG: 391 | Disposition: A | Discharge status: Home / Self Care | Payer: PRIVATE HEALTH INSURANCE | Attending: Surgery | Admitting: Surgery

## 2021-10-27 ENCOUNTER — Inpatient Hospital Stay: Payer: PRIVATE HEALTH INSURANCE

## 2021-10-27 ENCOUNTER — Emergency Department: Payer: PRIVATE HEALTH INSURANCE

## 2021-10-27 ENCOUNTER — Other Ambulatory Visit: Payer: Self-pay

## 2021-10-27 DIAGNOSIS — K5720 Diverticulitis of large intestine with perforation and abscess without bleeding: Secondary | ICD-10-CM | POA: Diagnosis present

## 2021-10-27 DIAGNOSIS — E669 Obesity, unspecified: Secondary | ICD-10-CM | POA: Diagnosis present

## 2021-10-27 DIAGNOSIS — I10 Essential (primary) hypertension: Secondary | ICD-10-CM | POA: Diagnosis present

## 2021-10-27 DIAGNOSIS — Z6833 Body mass index (BMI) 33.0-33.9, adult: Secondary | ICD-10-CM | POA: Diagnosis not present

## 2021-10-27 DIAGNOSIS — Z803 Family history of malignant neoplasm of breast: Secondary | ICD-10-CM | POA: Diagnosis not present

## 2021-10-27 DIAGNOSIS — K5732 Diverticulitis of large intestine without perforation or abscess without bleeding: Secondary | ICD-10-CM

## 2021-10-27 DIAGNOSIS — K651 Peritoneal abscess: Secondary | ICD-10-CM | POA: Diagnosis present

## 2021-10-27 DIAGNOSIS — F17210 Nicotine dependence, cigarettes, uncomplicated: Secondary | ICD-10-CM | POA: Diagnosis present

## 2021-10-27 DIAGNOSIS — Z8616 Personal history of COVID-19: Secondary | ICD-10-CM | POA: Diagnosis not present

## 2021-10-27 DIAGNOSIS — K219 Gastro-esophageal reflux disease without esophagitis: Secondary | ICD-10-CM | POA: Diagnosis present

## 2021-10-27 DIAGNOSIS — K5792 Diverticulitis of intestine, part unspecified, without perforation or abscess without bleeding: Secondary | ICD-10-CM | POA: Diagnosis present

## 2021-10-27 LAB — URINALYSIS, ROUTINE W REFLEX MICROSCOPIC
Bacteria, UA: NONE SEEN
Bilirubin Urine: NEGATIVE
Glucose, UA: NEGATIVE mg/dL
Ketones, ur: NEGATIVE mg/dL
Leukocytes,Ua: NEGATIVE
Nitrite: NEGATIVE
Protein, ur: NEGATIVE mg/dL
Specific Gravity, Urine: 1.011 (ref 1.005–1.030)
pH: 5 (ref 5.0–8.0)

## 2021-10-27 LAB — CBC WITH DIFFERENTIAL/PLATELET
Abs Immature Granulocytes: 0.07 10*3/uL (ref 0.00–0.07)
Basophils Absolute: 0 10*3/uL (ref 0.0–0.1)
Basophils Relative: 0 %
Eosinophils Absolute: 0.2 10*3/uL (ref 0.0–0.5)
Eosinophils Relative: 1 %
HCT: 30.6 % — ABNORMAL LOW (ref 36.0–46.0)
Hemoglobin: 9.7 g/dL — ABNORMAL LOW (ref 12.0–15.0)
Immature Granulocytes: 1 %
Lymphocytes Relative: 8 %
Lymphs Abs: 1.1 10*3/uL (ref 0.7–4.0)
MCH: 21.7 pg — ABNORMAL LOW (ref 26.0–34.0)
MCHC: 31.7 g/dL (ref 30.0–36.0)
MCV: 68.3 fL — ABNORMAL LOW (ref 80.0–100.0)
Monocytes Absolute: 0.9 10*3/uL (ref 0.1–1.0)
Monocytes Relative: 7 %
Neutro Abs: 10.6 10*3/uL — ABNORMAL HIGH (ref 1.7–7.7)
Neutrophils Relative %: 83 %
Platelets: 458 10*3/uL — ABNORMAL HIGH (ref 150–400)
RBC: 4.48 MIL/uL (ref 3.87–5.11)
RDW: 19 % — ABNORMAL HIGH (ref 11.5–15.5)
WBC: 12.9 10*3/uL — ABNORMAL HIGH (ref 4.0–10.5)
nRBC: 0 % (ref 0.0–0.2)

## 2021-10-27 LAB — COMPREHENSIVE METABOLIC PANEL
ALT: 12 U/L (ref 0–44)
AST: 16 U/L (ref 15–41)
Albumin: 3.7 g/dL (ref 3.5–5.0)
Alkaline Phosphatase: 68 U/L (ref 38–126)
Anion gap: 7 (ref 5–15)
BUN: 8 mg/dL (ref 6–20)
CO2: 23 mmol/L (ref 22–32)
Calcium: 8.9 mg/dL (ref 8.9–10.3)
Chloride: 103 mmol/L (ref 98–111)
Creatinine, Ser: 0.6 mg/dL (ref 0.44–1.00)
GFR, Estimated: >60 mL/min (ref >60)
Glucose, Bld: 106 mg/dL — ABNORMAL HIGH (ref 70–99)
Potassium: 3.5 mmol/L (ref 3.5–5.1)
Sodium: 133 mmol/L — ABNORMAL LOW (ref 135–145)
Total Bilirubin: 0.7 mg/dL (ref 0.3–1.2)
Total Protein: 8.1 g/dL (ref 6.5–8.1)

## 2021-10-27 LAB — LIPASE, BLOOD: Lipase: 27 U/L (ref 11–51)

## 2021-10-27 LAB — PROTIME-INR
INR: 1.2 (ref 0.8–1.2)
Prothrombin Time: 15.3 seconds — ABNORMAL HIGH (ref 11.4–15.2)

## 2021-10-27 LAB — RESP PANEL BY RT-PCR (FLU A&B, COVID) ARPGX2
Influenza A by PCR: NEGATIVE
Influenza B by PCR: NEGATIVE
SARS Coronavirus 2 by RT PCR: NEGATIVE

## 2021-10-27 MED ORDER — ONDANSETRON HCL 4 MG/2ML IJ SOLN: 4.0000 mg | Freq: Four times a day (QID) | INTRAMUSCULAR | Status: DC | PRN

## 2021-10-27 MED ORDER — IRBESARTAN 150 MG PO TABS
300.0000 mg | ORAL_TABLET | Freq: Every day | ORAL | Status: DC
  Administered 2021-10-27 – 2021-10-31 (×5): 300 mg via ORAL
  Filled 2021-10-27 (×5): qty 2

## 2021-10-27 MED ORDER — LACTATED RINGERS IV BOLUS
1000.0000 mL | Freq: Once | INTRAVENOUS | Status: AC
  Administered 2021-10-27: 1000 mL via INTRAVENOUS

## 2021-10-27 MED ORDER — HYDROCHLOROTHIAZIDE 25 MG PO TABS
25.0000 mg | ORAL_TABLET | Freq: Every day | ORAL | Status: DC
  Administered 2021-10-27 – 2021-10-31 (×5): 25 mg via ORAL
  Filled 2021-10-27 (×5): qty 1

## 2021-10-27 MED ORDER — VALSARTAN-HYDROCHLOROTHIAZIDE 320-25 MG PO TABS: 1.0000 | ORAL_TABLET | Freq: Every day | ORAL | Status: DC

## 2021-10-27 MED ORDER — SODIUM CHLORIDE 0.9% FLUSH
5.0000 mL | Freq: Three times a day (TID) | INTRAVENOUS | Status: DC
  Administered 2021-10-27 – 2021-10-31 (×11): 5 mL

## 2021-10-27 MED ORDER — ONDANSETRON 4 MG PO TBDP: 4.0000 mg | ORAL_TABLET | Freq: Four times a day (QID) | ORAL | Status: DC | PRN

## 2021-10-27 MED ORDER — FENTANYL CITRATE (PF) 100 MCG/2ML IJ SOLN
INTRAMUSCULAR | Status: AC | PRN
  Administered 2021-10-27 (×2): 50 ug via INTRAVENOUS

## 2021-10-27 MED ORDER — IOHEXOL 300 MG/ML  SOLN
100.0000 mL | Freq: Once | INTRAMUSCULAR | Status: AC | PRN
  Administered 2021-10-27: 100 mL via INTRAVENOUS

## 2021-10-27 MED ORDER — MORPHINE SULFATE (PF) 4 MG/ML IV SOLN
4.0000 mg | Freq: Once | INTRAVENOUS | Status: AC
  Administered 2021-10-27: 4 mg via INTRAVENOUS
  Filled 2021-10-27: qty 1

## 2021-10-27 MED ORDER — PIPERACILLIN-TAZOBACTAM 3.375 G IVPB 30 MIN
3.3750 g | Freq: Once | INTRAVENOUS | Status: AC
  Administered 2021-10-27: 3.375 g via INTRAVENOUS
  Filled 2021-10-27: qty 50

## 2021-10-27 MED ORDER — SODIUM CHLORIDE 0.9 % IV SOLN: INTRAVENOUS | Status: DC

## 2021-10-27 MED ORDER — DIPHENHYDRAMINE HCL 50 MG/ML IJ SOLN: 25.0000 mg | Freq: Four times a day (QID) | INTRAMUSCULAR | Status: DC | PRN

## 2021-10-27 MED ORDER — ENOXAPARIN SODIUM 40 MG/0.4ML IJ SOSY
40.0000 mg | PREFILLED_SYRINGE | INTRAMUSCULAR | Status: DC
  Administered 2021-10-28 – 2021-10-31 (×4): 40 mg via SUBCUTANEOUS
  Filled 2021-10-27 (×4): qty 0.4

## 2021-10-27 MED ORDER — MELOXICAM 7.5 MG PO TABS
15.0000 mg | ORAL_TABLET | Freq: Every day | ORAL | Status: DC
  Filled 2021-10-27 (×5): qty 2

## 2021-10-27 MED ORDER — FENTANYL CITRATE (PF) 100 MCG/2ML IJ SOLN
INTRAMUSCULAR | Status: AC
  Filled 2021-10-27: qty 2

## 2021-10-27 MED ORDER — AMLODIPINE BESYLATE 5 MG PO TABS
2.5000 mg | ORAL_TABLET | Freq: Every day | ORAL | Status: DC
  Administered 2021-10-27 – 2021-10-31 (×4): 2.5 mg via ORAL
  Filled 2021-10-27 (×5): qty 1

## 2021-10-27 MED ORDER — TRAMADOL HCL 50 MG PO TABS
50.0000 mg | ORAL_TABLET | Freq: Four times a day (QID) | ORAL | Status: DC | PRN
  Administered 2021-10-28: 50 mg via ORAL
  Filled 2021-10-27: qty 1

## 2021-10-27 MED ORDER — MIDAZOLAM HCL 2 MG/2ML IJ SOLN
INTRAMUSCULAR | Status: AC | PRN
  Administered 2021-10-27 (×2): 1 mg via INTRAVENOUS

## 2021-10-27 MED ORDER — MORPHINE SULFATE (PF) 2 MG/ML IV SOLN
2.0000 mg | INTRAVENOUS | Status: DC | PRN
  Administered 2021-10-27: 2 mg via INTRAVENOUS
  Filled 2021-10-27: qty 1

## 2021-10-27 MED ORDER — ONDANSETRON HCL 4 MG/2ML IJ SOLN
4.0000 mg | Freq: Once | INTRAMUSCULAR | Status: AC
  Administered 2021-10-27: 4 mg via INTRAVENOUS
  Filled 2021-10-27: qty 2

## 2021-10-27 MED ORDER — PANTOPRAZOLE SODIUM 40 MG PO TBEC
40.0000 mg | DELAYED_RELEASE_TABLET | Freq: Two times a day (BID) | ORAL | Status: DC
  Administered 2021-10-27 – 2021-10-31 (×9): 40 mg via ORAL
  Filled 2021-10-27 (×9): qty 1

## 2021-10-27 MED ORDER — MIDAZOLAM HCL 2 MG/2ML IJ SOLN
INTRAMUSCULAR | Status: AC
  Filled 2021-10-27: qty 2

## 2021-10-27 MED ORDER — DIPHENHYDRAMINE HCL 25 MG PO CAPS: 25.0000 mg | ORAL_CAPSULE | Freq: Four times a day (QID) | ORAL | Status: DC | PRN

## 2021-10-27 MED ORDER — HYDROCODONE-ACETAMINOPHEN 5-325 MG PO TABS
1.0000 | ORAL_TABLET | ORAL | Status: DC | PRN
  Administered 2021-10-27 (×2): 2 via ORAL
  Administered 2021-10-28 (×3): 1 via ORAL
  Administered 2021-10-28 – 2021-10-29 (×2): 2 via ORAL
  Administered 2021-10-29 (×2): 1 via ORAL
  Administered 2021-10-30 (×3): 2 via ORAL
  Administered 2021-10-31: 1 via ORAL
  Filled 2021-10-27: qty 1
  Filled 2021-10-27 (×2): qty 2
  Filled 2021-10-27: qty 1
  Filled 2021-10-27 (×4): qty 2
  Filled 2021-10-27 (×4): qty 1
  Filled 2021-10-27: qty 2

## 2021-10-27 MED ORDER — PIPERACILLIN-TAZOBACTAM 3.375 G IVPB: 3.3750 g | Freq: Three times a day (TID) | INTRAVENOUS | Status: DC

## 2021-10-27 MED ORDER — PIPERACILLIN-TAZOBACTAM 3.375 G IVPB
3.3750 g | Freq: Three times a day (TID) | INTRAVENOUS | Status: DC
  Administered 2021-10-27 – 2021-10-31 (×12): 3.375 g via INTRAVENOUS
  Filled 2021-10-27 (×12): qty 50

## 2021-10-27 NOTE — ED Triage Notes (Signed)
Pt to ED from home c/o LLQ pain since Monday.  States hx of diverticulitis and feels the same, denies n/v/d.  Pain is constant.  States painful urination as well.  Pt A&Ox4, chest rise even and unlabored, skin WNL and in NAD at this time.

## 2021-10-27 NOTE — Consult Note (Signed)
Chief Complaint: Patient was seen in consultation today for  Chief Complaint  Patient presents with   Abdominal Pain    Referring Physician(s): Surgical Team  Supervising Physician: Simonne Come  Patient Status: [facility] - ED  History of Present Illness: Adrienne Adkins is a 55 y.o. female with a medical history significant for HTN, migraines and diverticulitis who presented to the [facility] ED with abdominal pain. The pain had been ongoing for 5 days in the left lower quadrant. She has been previously hospitalized for diverticulitis and has required IV antibiotics. Imaging obtained in the ED shows perforated diverticulitis with abscess.  CT abdomen/pelvis with contrast 10/27/21 IMPRESSION: 1. Progression of Complicated Sigmoid Diverticulitis since October. There is now a discrete contained perforation and abscess tracking between and inseparable from both the sigmoid colon and urinary bladder. Estimated volume of this collection is 39 mL. Severe associated bladder inflammation, and the junction of the rectosigmoid colon also appears adhered to the dorsal bladder. Associated inflammation and adhesions also in the adjacent distal small bowel mesentery. But no dilated small bowel. No gas within the bladder at this time. No free intraperitoneal air or fluid. 2. Underlying advanced diverticulosis of the large bowel. 3. Aortic Atherosclerosis (ICD10-I70.0).  Lumbar facet arthropathy.  Interventional Radiology has been asked to evaluate this patient for an image-guided intra-abdominal fluid collection aspiration with possible drain placement. This case was reviewed and procedure approved by Dr. Grace Isaac.    Past Medical History:  Diagnosis Date   Carpal tunnel syndrome    COVID-19 06/2019   Dermatitis    Diverticular disease    GERD (gastroesophageal reflux disease)    History of kidney stones    Hypertension    IGT (impaired glucose tolerance)    Lumbago    Migraines    Palpitations     Vertigo     Past Surgical History:  Procedure Laterality Date   ABDOMINAL HYSTERECTOMY     APPENDECTOMY     COLONOSCOPY     INCISION AND DRAINAGE ABSCESS Left 02/02/2021   Procedure: INCISION AND DRAINAGE ABSCESS;  Surgeon: Sung Amabile, DO;  Location: [facility] ORS;  Service: General;  Laterality: Left;  IODOFORM PACKING    INDUCED ABORTION     OOPHORECTOMY      Allergies: Beta adrenergic blockers, Amoxicillin, and Sulfa antibiotics  Medications: Prior to Admission medications   Medication Sig Start Date End Date Taking? Authorizing Provider  amLODipine (NORVASC) 2.5 MG tablet Take 2.5 mg by mouth daily. 10/11/16  Yes [provider]  dicyclomine (BENTYL) 10 MG capsule TAKE 1 CAPSULE BY MOUTH 4 TIMES DAILY AS NEEDED FOR UP TO 14 DAYS FOR SPASMS (STOMACH CRAMPS). 07/05/21  Yes [provider]  meloxicam (MOBIC) 15 MG tablet Take 15 mg by mouth daily. 08/15/21  Yes [provider]  pantoprazole (PROTONIX) 40 MG tablet Take 1 tablet (40 mg total) by mouth 2 (two) times daily. 07/05/21 10/27/21 Yes Sreenath, Sudheer B, MD  pantoprazole (PROTONIX) 40 MG tablet Take 1 tablet by mouth daily. 08/08/21  Yes [provider]  traMADol (ULTRAM) 50 MG tablet Take 1 tablet (50 mg total) by mouth every 6 (six) hours as needed. 09/25/21 09/25/22 Yes Sakai, Isami, DO  valsartan-hydrochlorothiazide (DIOVAN-HCT) 320-25 MG tablet Take 1 tablet by mouth daily.    Yes [provider]  Sod Picosulfate-Mag Ox-Cit Acd (CLENPIQ) 10-3.5-12 MG-GM -GM/160ML SOLN Take 1 bottle at 5 PM followed by five 8 oz cups of water and repeat 5 hours before procedure. Patient  not taking: Reported on 10/27/2021 08/02/21   Wyline Mood, MD  SUTAB [phone] MG TABS Take by mouth. Patient not taking: Reported on 10/27/2021 09/10/21   [provider]     Family History  Problem Relation Age of Onset   Breast cancer Maternal Aunt     Social History   Socioeconomic History    Marital status: Single    Spouse name: Not on file   Number of children: Not on file   Years of education: Not on file   Highest education level: Not on file  Occupational History   Not on file  Tobacco Use   Smoking status: Every Day    Types: Cigarettes   Smokeless tobacco: Never  Vaping Use   Vaping Use: Never used  Substance and Sexual Activity   Alcohol use: Yes    Comment: social   Drug use: No   Sexual activity: Not on file  Other Topics Concern   Not on file  Social History Narrative   Not on file   Social Determinants of Health   Financial Resource Strain: Not on file  Food Insecurity: Not on file  Transportation Needs: Not on file  Physical Activity: Not on file  Stress: Not on file  Social Connections: Not on file    Review of Systems: A 12 point ROS discussed and pertinent positives are indicated in the HPI above.  All other systems are negative.  Review of Systems  Constitutional:  Positive for appetite change and fatigue.  Respiratory:  Negative for cough and shortness of breath.   Cardiovascular:  Negative for chest pain and leg swelling.  Gastrointestinal:  Positive for abdominal pain. Negative for diarrhea, nausea and vomiting.  Neurological:  Negative for dizziness and headaches.   Vital Signs: BP 94/68   Pulse 76   Temp 99.1 F (37.3 C) (Oral)   Resp 16   Ht 5\' 1"  (1.549 m)   Wt 175 lb (79.4 kg)   SpO2 100%   BMI 33.07 kg/m   Physical Exam Constitutional:      General: She is not in acute distress.    Appearance: She is not ill-appearing.  HENT:     Mouth/Throat:     Mouth: Mucous membranes are moist.     Pharynx: Oropharynx is clear.  Cardiovascular:     Rate and Rhythm: Normal rate and regular rhythm.     Pulses: Normal pulses.     Heart sounds: Normal heart sounds.  Pulmonary:     Effort: Pulmonary effort is normal.     Breath sounds: Normal breath sounds.  Abdominal:     Palpations: Abdomen is soft.     Tenderness: There is  abdominal tenderness.     Comments: LLQ tenderness  Musculoskeletal:        General: Normal range of motion.  Skin:    General: Skin is warm and dry.  Neurological:     Mental Status: She is alert and oriented to person, place, and time.  Psychiatric:        Mood and Affect: Mood normal.        Behavior: Behavior normal.        Thought Content: Thought content normal.        Judgment: Judgment normal.    Imaging: CT Abdomen Pelvis W Contrast  Result Date: 10/27/2021 CLINICAL DATA:  55 year old female with left lower quadrant pain for 4 days. Suspected complicated diverticulitis in October. Painful urination. EXAM: CT ABDOMEN AND PELVIS  WITH CONTRAST TECHNIQUE: Multidetector CT imaging of the abdomen and pelvis was performed using the standard protocol following bolus administration of intravenous contrast. CONTRAST:  OMNIPAQUE IOHEXOL 300 MG/ML  SOLN COMPARISON:  CT Abdomen and Pelvis 09/20/2021, 11/24/2017. FINDINGS: Lower chest: Negative. Hepatobiliary: Negative liver and gallbladder. Pancreas: Negative. Spleen: Negative. Adrenals/Urinary Tract: Normal adrenal glands. Nonobstructed kidneys. Symmetric renal enhancement and contrast excretion. No hydroureter. Inflammation along the course of the distal left ureter from the pelvic inlet. Highly abnormal urinary bladder appears thick-walled, and adhesed to a contained perforation/abscess located in the mesentery between the sigmoid colon and bladder. The extraluminal rim enhancing multiloculated collection is visible on series 2, image 68 and sagittal image 77 and coronal image 50 encompassing 69 x 40 by 28 mm (AP by transverse by CC), for an estimated volume 39 mL. See additional abnormality of the distal large and small bowel below. There is no gas within the urinary bladder, but there is moderate regional inflammatory stranding. Stomach/Bowel: Distal rectum is negative aside from retained stool. The distal descending colon appears adhesed to  the vaginal cough and dorsal urinary bladder on series 2, image 64. Overlying the bladder dome is an irregular gas and fluid containing thick-walled collection compatible with contained perforation and abscess. Inflammation throughout that region contiguous with the sigmoid mesentery and sigmoid colon overlying the bladder dome where extensive diverticulosis and moderate sigmoid wall thickening are demonstrated. Inflammation also affects the regional distal small bowel mesentery. Several small bowel loops are nondilated but probably also adhered (series 2, image 58). Extensive diverticulosis continues throughout the descending colon to the splenic flexure. Moderate diverticulosis also of the right colon. No dilated small bowel. Decompressed and negative terminal ileum. Appendix not identified. No free intraperitoneal air or fluid. Stomach and duodenum within normal limits. Vascular/Lymphatic: Major arterial structures in the abdomen and pelvis remain patent with mild Aortoiliac calcified atherosclerosis. Portal venous system is patent. Small reactive appearing lymph nodes in the sigmoid mesentery and along the pelvic sidewalls. Reproductive: Chronically absent uterus. Diminutive or absent ovaries. Other: No pelvic free fluid. Musculoskeletal: Chronic severe lower lumbar facet arthropathy. Chronic SI joint and pubic symphysis degeneration. No acute osseous abnormality identified. IMPRESSION: 1. Progression of Complicated Sigmoid Diverticulitis since October. There is now a discrete contained perforation and abscess tracking between and inseparable from both the sigmoid colon and urinary bladder. Estimated volume of this collection is 39 mL. Severe associated bladder inflammation, and the junction of the rectosigmoid colon also appears adhered to the dorsal bladder. Associated inflammation and adhesions also in the adjacent distal small bowel mesentery. But no dilated small bowel. No gas within the bladder at this  time. No free intraperitoneal air or fluid. 2. Underlying advanced diverticulosis of the large bowel. 3. Aortic Atherosclerosis (ICD10-I70.0).  Lumbar facet arthropathy. Electronically Signed   By: Odessa Fleming M.D.   On: 10/27/2021 06:31    Labs:  CBC: Recent Labs    09/23/21 0405 09/24/21 0501 09/25/21 0434 10/27/21 0258  WBC 6.1 4.8 5.4 12.9*  HGB 10.2* 9.9* 9.6* 9.7*  HCT 31.1* 29.9* 29.8* 30.6*  PLT 493* 460* 467* 458*    COAGS: Recent Labs    07/03/21 1448 07/04/21 0349  INR 1.0 1.0  APTT 26  --     BMP: Recent Labs    09/23/21 0405 09/24/21 0501 09/25/21 0434 10/27/21 0258  NA 136 137 138 133*  K 3.7 3.6 3.6 3.5  CL 102 103 103 103  CO2 26 26 28  23  GLUCOSE 85 92 92 106*  BUN CALCIUM 9.2 8.8* 8.9 8.9  CREATININE 0.66 0.52 0.63 0.60  GFRNONAA >60 >60 >60 >60    LIVER FUNCTION TESTS: Recent Labs    07/03/21 1242 07/04/21 0349 09/19/21 2138 10/27/21 0258  BILITOT 0.7 0.7 0.8 0.7  AST ALT ALKPHOS 56 50 67 68  PROT 7.0 6.3* 7.8 8.1  ALBUMIN 3.6 3.2* 3.9 3.7    TUMOR MARKERS: No results for input(s): AFPTM, CEA, CA199, CHROMGRNA in the last 8760 hours.  Assessment and Plan:  Perforated diverticulitis with abscess: Adrienne Adkins, 55 year old female, is scheduled today for an image-guided intra-abdominal fluid collection aspiration with possible drain placement. The procedure was discussed with the patient at the bedside in the ED.  Risks and benefits discussed with the patient including bleeding, infection, damage to adjacent structures, bowel perforation/fistula connection, and sepsis.  All of the patient's questions were answered, patient is agreeable to proceed. She has been NPO.  Consent signed and in the [facility] CT department.   Thank you for this interesting consult.  I greatly enjoyed meeting Adrienne Adkins and look forward to participating in their care.  A copy of this report was sent to the requesting  provider on this date.  Electronically Signed: Alwyn Ren, AGACNP-BC [phone] 10/27/2021, 8:16 AM   I spent a total of 20 Minutes    in face to face in clinical consultation, greater than 50% of which was counseling/coordinating care for diverticular abscess aspiration with drain placement.

## 2021-10-27 NOTE — Procedures (Signed)
Pre procedural Dx: Diverticular abscess Post procedural Dx: Same  Technically successful CT guided placed of a 10 Fr drainage catheter placement into the diverticular abscess yielding 30 cc of purulent, slightly bloody fluid.    A representative aspirated sample was capped and sent to the laboratory for analysis.    EBL: Trace Complications: None immediate  Katherina Right, MD Pager #: [phone]

## 2021-10-27 NOTE — Progress Notes (Signed)
Patient admitted and oriented to room 211. VSS. Patient alert and oriented x 4. Patient complains of pain 7/10. RN repositioned patient and she expressed relief.

## 2021-10-27 NOTE — ED Notes (Signed)
Patient transported to CT for drain placement. 

## 2021-10-27 NOTE — ED Provider Notes (Signed)
[facility] Emergency Department Provider Note   ____________________________________________   Event Date/Time   First MD Initiated Contact with Patient 10/27/21 0451     (approximate)  I have reviewed the triage vital signs and the nursing notes.   HISTORY  Chief Complaint Abdominal Pain    HPI Adrienne Adkins is a 55 y.o. female with past medical history of hypertension, migraines, GERD, and diverticulitis who presents to the ED complaining of abdominal pain.  Patient reports that she has had about 5 days of constant pain in the left lower quadrant of her abdomen that has been gradually worsening.  She describes this as similar to prior episodes of diverticulitis, for which she required admission to the hospital last month.  She states she completed a course of Cipro and Flagyl at that time, has been feeling better up until 5 days ago.  She denies any fevers but does endorse nausea, vomiting, and dysuria.  She has not had any diarrhea or blood in her stool.  There was concern for colovesical fistula at the time of her diagnosis last month and plan was for surgical intervention next few months past.        Past Medical History:  Diagnosis Date   Carpal tunnel syndrome    COVID-19 06/2019   Dermatitis    Diverticular disease    GERD (gastroesophageal reflux disease)    History of kidney stones    Hypertension    IGT (impaired glucose tolerance)    Lumbago    Migraines    Palpitations    Vertigo     Patient Active Problem List   Diagnosis Date Noted   Diverticulitis large intestine 09/20/2021   Obesity (BMI 30-39.9) 08/02/2021   Acute blood loss anemia 07/04/2021   Abdominal pain 07/04/2021   Intra-abdominal abscess (HCC) 07/04/2021   Diverticulitis 07/04/2021   Acute prerenal azotemia 07/04/2021   GERD (gastroesophageal reflux disease)    Hypertension    Acute lower GI bleeding 07/03/2021   Gluteal abscess 02/02/2021   IGT (impaired  glucose tolerance) 11/27/2011    Past Surgical History:  Procedure Laterality Date   ABDOMINAL HYSTERECTOMY     APPENDECTOMY     COLONOSCOPY     INCISION AND DRAINAGE ABSCESS Left 02/02/2021   Procedure: INCISION AND DRAINAGE ABSCESS;  Surgeon: Sung Amabile, DO;  Location: [facility] ORS;  Service: General;  Laterality: Left;  IODOFORM PACKING    INDUCED ABORTION     OOPHORECTOMY      Prior to Admission medications   Medication Sig Start Date End Date Taking? Authorizing Provider  amLODipine (NORVASC) 2.5 MG tablet Take 2.5 mg by mouth daily. 10/11/16   [provider]  meloxicam (MOBIC) 15 MG tablet Take 15 mg by mouth daily. 08/15/21   [provider]  pantoprazole (PROTONIX) 40 MG tablet Take 1 tablet (40 mg total) by mouth 2 (two) times daily. 07/05/21 08/04/21  Tresa Moore, MD  pantoprazole (PROTONIX) 40 MG tablet Take 1 tablet by mouth daily. 08/08/21   [provider]  Sod Picosulfate-Mag Ox-Cit Acd (CLENPIQ) 10-3.5-12 MG-GM -GM/160ML SOLN Take 1 bottle at 5 PM followed by five 8 oz cups of water and repeat 5 hours before procedure. 08/02/21   Wyline Mood, MD  SUTAB [phone] MG TABS Take by mouth. 09/10/21   [provider]  traMADol (ULTRAM) 50 MG tablet Take 1 tablet (50 mg total) by mouth every 6 (six) hours as needed. 09/25/21 09/25/22  Sung Amabile, DO  valsartan-hydrochlorothiazide (DIOVAN-HCT) 320-25 MG tablet Take 1 tablet by mouth daily.     [provider]    Allergies Beta adrenergic blockers, Amoxicillin, and Sulfa antibiotics  Family History  Problem Relation Age of Onset   Breast cancer Maternal Aunt     Social History Social History   Tobacco Use   Smoking status: Every Day    Types: Cigarettes   Smokeless tobacco: Never  Vaping Use   Vaping Use: Never used  Substance Use Topics   Alcohol use: Yes    Comment: social   Drug use: No    Review of Systems  Constitutional: No fever/chills Eyes: No visual  changes. ENT: No sore throat. Cardiovascular: Denies chest pain. Respiratory: Denies shortness of breath. Gastrointestinal: Positive for abdominal pain, nausea, and vomiting.  No diarrhea.  No constipation. Genitourinary: Positive for dysuria. Musculoskeletal: Negative for back pain. Skin: Negative for rash. Neurological: Negative for headaches, focal weakness or numbness.  ____________________________________________   PHYSICAL EXAM:  VITAL SIGNS: ED Triage Vitals  Enc Vitals Group     BP 10/27/21 0254 (!) 121/92     Pulse Rate 10/27/21 0254 95     Resp 10/27/21 0254 20     Temp 10/27/21 0403 99 F (37.2 C)     Temp Source 10/27/21 0403 Oral     SpO2 10/27/21 0254 98 %     Weight 10/27/21 0255 175 lb (79.4 kg)     Height 10/27/21 0255 5\' 1"  (1.549 m)     Head Circumference --      Peak Flow --      Pain Score 10/27/21 0255 10     Pain Loc --      Pain Edu? --      Excl. in GC? --     Constitutional: Alert and oriented. Eyes: Conjunctivae are normal. Head: Atraumatic. Nose: No congestion/rhinnorhea. Mouth/Throat: Mucous membranes are moist. Neck: Normal ROM Cardiovascular: Normal rate, regular rhythm. Grossly normal heart sounds.  2+ radial pulses bilaterally. Respiratory: Normal respiratory effort.  No retractions. Lungs CTAB. Gastrointestinal: Soft and tender to palpation of the left lower quadrant with no rebound or guarding. No distention. Genitourinary: deferred Musculoskeletal: No lower extremity tenderness nor edema. Neurologic:  Normal speech and language. No gross focal neurologic deficits are appreciated. Skin:  Skin is warm, dry and intact. No rash noted. Psychiatric: Mood and affect are normal. Speech and behavior are normal.  ____________________________________________   LABS (all labs ordered are listed, but only abnormal results are displayed)  Labs Reviewed  CBC WITH DIFFERENTIAL/PLATELET - Abnormal; Notable for the following components:       Result Value   WBC 12.9 (*)    Hemoglobin 9.7 (*)    HCT 30.6 (*)    MCV 68.3 (*)    MCH 21.7 (*)    RDW 19.0 (*)    Platelets 458 (*)    Neutro Abs 10.6 (*)    All other components within normal limits  COMPREHENSIVE METABOLIC PANEL - Abnormal; Notable for the following components:   Sodium 133 (*)    Glucose, Bld 106 (*)    All other components within normal limits  URINALYSIS, ROUTINE W REFLEX MICROSCOPIC - Abnormal; Notable for the following components:   Color, Urine YELLOW (*)    APPearance CLEAR (*)    Hgb urine dipstick SMALL (*)    All other components within normal limits  RESP PANEL BY RT-PCR (FLU A&B, COVID) [id number]  LIPASE, BLOOD    PROCEDURES  Procedure(s) performed (including Critical Care):  Procedures   ____________________________________________   INITIAL IMPRESSION / ASSESSMENT AND PLAN / ED COURSE      55 year old female with past medical history of hypertension, migraines, GERD, and recurrent diverticulitis presents to the ED with 5 days of left lower quadrant similar to prior episode of diverticulitis.  She is tender to palpation quadrant and we will further assess with CT scan for diverticulitis potential admission.  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  Labs are unremarkable, we will treat symptomatically with IV morphine and Zofran, hydrate with IV fluids and reassess.  CT scan shows aggressive diverticulitis now with contained perforation and developing an abscess.  Discussed with Dr. Tonna Boehringer of surgery, who will admit the patient to his service.  We will treat with Zosyn given patient will need alternative antibiotic and only has mild allergy to penicillins.      ____________________________________________   FINAL CLINICAL IMPRESSION(S) / ED DIAGNOSES  Final diagnoses:  Diverticulitis of large intestine with abscess without bleeding     ED Discharge Orders      None        Note:  This document was prepared using Dragon voice recognition software and may include unintentional dictation errors.    Chesley Noon, MD 10/27/21 [phone]

## 2021-10-27 NOTE — Progress Notes (Signed)
Pharmacy to Provider Communication   Valsartan is a non-formulary medication. Per protocol a therapeutic substitution has been made from Valsartan to Irbesartan.    Gardner Candle, PharmD, BCPS Clinical Pharmacist 10/27/2021 9:10 AM

## 2021-10-27 NOTE — ED Notes (Signed)
Pt to CT

## 2021-10-27 NOTE — ED Notes (Signed)
Pt assisted to BSC and back into bed.

## 2021-10-27 NOTE — H&P (Signed)
Subjective:   CC: diverticulitis, recurrent  HPI:  Adrienne Adkins is a 55 y.o. female who was consulted by [facility] for issue above.  Symptoms were first noted 5 days ago. Pain is worsening, sharp confined to the left lower quadrant, without radiation.  Associated with nothing specific, exacerbated by nothing specific.     Past Medical History:  has a past medical history of Carpal tunnel syndrome, COVID-19 (06/2019), Dermatitis, Diverticular disease, GERD (gastroesophageal reflux disease), History of kidney stones, Hypertension, IGT (impaired glucose tolerance), Lumbago, Migraines, Palpitations, and Vertigo.  Past Surgical History:  Past Surgical History:  Procedure Laterality Date   ABDOMINAL HYSTERECTOMY     APPENDECTOMY     COLONOSCOPY     INCISION AND DRAINAGE ABSCESS Left 02/02/2021   Procedure: INCISION AND DRAINAGE ABSCESS;  Surgeon: Sung Amabile, DO;  Location: [facility] ORS;  Service: General;  Laterality: Left;  IODOFORM PACKING    INDUCED ABORTION     OOPHORECTOMY      Family History: family history includes Breast cancer in her maternal aunt.  Social History:  reports that she has been smoking cigarettes. She has never used smokeless tobacco. She reports current alcohol use. She reports that she does not use drugs.  Current Medications:  Prior to Admission medications   Medication Sig Start Date End Date Taking? Authorizing Provider  amLODipine (NORVASC) 2.5 MG tablet Take 2.5 mg by mouth daily. 10/11/16  Yes [provider]  dicyclomine (BENTYL) 10 MG capsule TAKE 1 CAPSULE BY MOUTH 4 TIMES DAILY AS NEEDED FOR UP TO 14 DAYS FOR SPASMS (STOMACH CRAMPS). 07/05/21  Yes [provider]  meloxicam (MOBIC) 15 MG tablet Take 15 mg by mouth daily. 08/15/21  Yes [provider]  pantoprazole (PROTONIX) 40 MG tablet Take 1 tablet (40 mg total) by mouth 2 (two) times daily. 07/05/21 10/27/21 Yes Sreenath, Sudheer B, MD  pantoprazole (PROTONIX) 40 MG tablet Take 1  tablet by mouth daily. 08/08/21  Yes [provider]  traMADol (ULTRAM) 50 MG tablet Take 1 tablet (50 mg total) by mouth every 6 (six) hours as needed. 09/25/21 09/25/22 Yes Zyniah Ferraiolo, DO  valsartan-hydrochlorothiazide (DIOVAN-HCT) 320-25 MG tablet Take 1 tablet by mouth daily.    Yes [provider]  Sod Picosulfate-Mag Ox-Cit Acd (CLENPIQ) 10-3.5-12 MG-GM -GM/160ML SOLN Take 1 bottle at 5 PM followed by five 8 oz cups of water and repeat 5 hours before procedure. Patient not taking: Reported on 10/27/2021 08/02/21   Wyline Mood, MD  SUTAB [phone] MG TABS Take by mouth. Patient not taking: Reported on 10/27/2021 09/10/21   [provider]    Allergies:  Allergies as of 10/27/2021 - Review Complete 10/27/2021  Allergen Reaction Noted   Beta adrenergic blockers Other (See Comments) 02/23/2016   Amoxicillin Hives and Rash 02/23/2016   Sulfa antibiotics Hives 02/01/2021    ROS:  General: Denies weight loss, weight gain, fatigue, fevers, chills, and night sweats. Eyes: Denies blurry vision, double vision, eye pain, itchy eyes, and tearing. Ears: Denies hearing loss, earache, and ringing in ears. Nose: Denies sinus pain, congestion, infections, runny nose, and nosebleeds. Mouth/throat: Denies hoarseness, sore throat, bleeding gums, and difficulty swallowing. Heart: Denies chest pain, palpitations, racing heart, irregular heartbeat, leg pain or swelling, and decreased activity tolerance. Respiratory: Denies breathing difficulty, shortness of breath, wheezing, cough, and sputum. GI: Denies change in appetite, heartburn, nausea, vomiting, constipation, diarrhea, and blood in stool. GU: Denies difficulty urinating, pain with urinating, urgency, frequency, blood in urine. Musculoskeletal: Denies  joint stiffness, pain, swelling, muscle weakness. Skin: Denies rash, itching, mass, tumors, sores, and boils Neurologic: Denies headache, fainting, dizziness, seizures,  numbness, and tingling. Psychiatric: Denies depression, anxiety, difficulty sleeping, and memory loss. Endocrine: Denies heat or cold intolerance, and increased thirst or urination. Blood/lymph: Denies easy bruising, easy bruising, and swollen glands     Objective:     BP 114/85   Pulse 78   Temp 99.1 F (37.3 C) (Oral)   Resp (!) 21   Ht 5\' 1"  (1.549 m)   Wt 79.4 kg   SpO2 100%   BMI 33.07 kg/m   Constitutional :  alert, cooperative, appears stated age, and mild distress  Lymphatics/Throat:  no asymmetry, masses, or scars  Respiratory:  clear to auscultation bilaterally  Cardiovascular:  regular rate and rhythm  Gastrointestinal: Recurrent moderate TTP in LLQ and suprapubic region, but no guarding.  Remaining quadrants remain soft and no TTP .   Musculoskeletal: Steady movement  Skin: Cool and moist, no surgical scars   Psychiatric: Normal affect, non-agitated, not confused       LABS:  CMP Latest Ref Rng & Units 10/27/2021 09/25/2021 09/24/2021  Glucose 70 - 99 mg/dL 106(H) 92 92  BUN 6 - 20 mg/dL 8 17 9   Creatinine 0.44 - 1.00 mg/dL 0.60 0.63 0.52  Sodium 135 - 145 mmol/L 133(L) 138 137  Potassium 3.5 - 5.1 mmol/L 3.5 3.6 3.6  Chloride 98 - 111 mmol/L 103 103 103  CO2 22 - 32 mmol/L 23 28 26   Calcium 8.9 - 10.3 mg/dL 8.9 8.9 8.8(L)  Total Protein 6.5 - 8.1 g/dL 8.1 - -  Total Bilirubin 0.3 - 1.2 mg/dL 0.7 - -  Alkaline Phos 38 - 126 U/L 68 - -  AST 15 - 41 U/L 16 - -  ALT 0 - 44 U/L 12 - -   CBC Latest Ref Rng & Units 10/27/2021 09/25/2021 09/24/2021  WBC 4.0 - 10.5 K/uL 12.9(H) 5.4 4.8  Hemoglobin 12.0 - 15.0 g/dL 9.7(L) 9.6(L) 9.9(L)  Hematocrit 36.0 - 46.0 % 30.6(L) 29.8(L) 29.9(L)  Platelets 150 - 400 K/uL 458(H) 467(H) 460(H)    RADS: CLINICAL DATA:  55 year old female with left lower quadrant pain for 4 days. Suspected complicated diverticulitis in October. Painful urination.   EXAM: CT ABDOMEN AND PELVIS WITH CONTRAST   TECHNIQUE: Multidetector  CT imaging of the abdomen and pelvis was performed using the standard protocol following bolus administration of intravenous contrast.   CONTRAST:  141mL OMNIPAQUE IOHEXOL 300 MG/ML  SOLN   COMPARISON:  CT Abdomen and Pelvis 09/20/2021, 11/24/2017.   FINDINGS: Lower chest: Negative.   Hepatobiliary: Negative liver and gallbladder.   Pancreas: Negative.   Spleen: Negative.   Adrenals/Urinary Tract: Normal adrenal glands. Nonobstructed kidneys. Symmetric renal enhancement and contrast excretion. No hydroureter.   Inflammation along the course of the distal left ureter from the pelvic inlet. Highly abnormal urinary bladder appears thick-walled, and adhesed to a contained perforation/abscess located in the mesentery between the sigmoid colon and bladder. The extraluminal rim enhancing multiloculated collection is visible on series 2, image 68 and sagittal image 77 and coronal image 50 encompassing 69 x 40 by 28 mm (AP by transverse by CC), for an estimated volume 39 mL. See additional abnormality of the distal large and small bowel below. There is no gas within the urinary bladder, but there is moderate regional inflammatory stranding.   Stomach/Bowel: Distal rectum is negative aside from retained stool.   The distal descending colon  appears adhesed to the vaginal cough and dorsal urinary bladder on series 2, image 64. Overlying the bladder dome is an irregular gas and fluid containing thick-walled collection compatible with contained perforation and abscess. Inflammation throughout that region contiguous with the sigmoid mesentery and sigmoid colon overlying the bladder dome where extensive diverticulosis and moderate sigmoid wall thickening are demonstrated. Inflammation also affects the regional distal small bowel mesentery. Several small bowel loops are nondilated but probably also adhered (series 2, image 58).   Extensive diverticulosis continues throughout the  descending colon to the splenic flexure. Moderate diverticulosis also of the right colon. No dilated small bowel. Decompressed and negative terminal ileum. Appendix not identified. No free intraperitoneal air or fluid. Stomach and duodenum within normal limits.   Vascular/Lymphatic: Major arterial structures in the abdomen and pelvis remain patent with mild Aortoiliac calcified atherosclerosis. Portal venous system is patent. Small reactive appearing lymph nodes in the sigmoid mesentery and along the pelvic sidewalls.   Reproductive: Chronically absent uterus. Diminutive or absent ovaries.   Other: No pelvic free fluid.   Musculoskeletal: Chronic severe lower lumbar facet arthropathy. Chronic SI joint and pubic symphysis degeneration. No acute osseous abnormality identified.   IMPRESSION: 1. Progression of Complicated Sigmoid Diverticulitis since October. There is now a discrete contained perforation and abscess tracking between and inseparable from both the sigmoid colon and urinary bladder. Estimated volume of this collection is 39 mL. Severe associated bladder inflammation, and the junction of the rectosigmoid colon also appears adhered to the dorsal bladder. Associated inflammation and adhesions also in the adjacent distal small bowel mesentery. But no dilated small bowel. No gas within the bladder at this time. No free intraperitoneal air or fluid.   2. Underlying advanced diverticulosis of the large bowel.   3. Aortic Atherosclerosis (ICD10-I70.0).  Lumbar facet arthropathy.     Electronically Signed   By: Genevie Ann M.D.   On: 10/27/2021 06:31 Assessment:   Diverticulitis, recurrent  Plan:   Brief period where symptoms resolved since last visit.  With the new abscess formation noted on CT, images which I personally reviewed and agree with, I recommended IR guided drainage if patient wishes to still postpone any definitive surgical intervention.  I explained to  patient that due to the very brief period of symptom resolution, and the degree of severity with her acute episodes, I believe it is at the point where surgical resection of the disease portion will be necessary in the near future.  We again discussed briefly the risks benefits alternatives to the surgery, including possible placement of an ostomy.  Patient again and still is extremely hesitant on pursuing any surgery due to the risk of ostomy creation, requested that we try IR guided drainage to see if we can get over this acute episode.  I explained to her that surgical resection again likely will be needed even if this acute episode resolves, for which she seems to be more agreeable at this time, especially with the decreased risk of ostomy creation if the surgery is done in between acute episodes.  Discussed case with IR and they are agreeable to attempting drainage of abscess.  We will have her admitted and continue antibiotics until then, and monitor symptom resolution closely.  I ended our conversation stating there still may be a chance she is may end up needing surgery during the stay if her symptoms do not resolve.  Patient verbalized understanding and all questions addressed at this time.

## 2021-10-27 NOTE — ED Notes (Signed)
Pt back from IR, complaining of 10/10 abd pain

## 2021-10-28 LAB — BASIC METABOLIC PANEL
Anion gap: 10 (ref 5–15)
BUN: 12 mg/dL (ref 6–20)
CO2: 25 mmol/L (ref 22–32)
Calcium: 8.5 mg/dL — ABNORMAL LOW (ref 8.9–10.3)
Chloride: 101 mmol/L (ref 98–111)
Creatinine, Ser: 0.77 mg/dL (ref 0.44–1.00)
GFR, Estimated: >60 mL/min (ref >60)
Glucose, Bld: 80 mg/dL (ref 70–99)
Potassium: 3.2 mmol/L — ABNORMAL LOW (ref 3.5–5.1)
Sodium: 136 mmol/L (ref 135–145)

## 2021-10-28 LAB — MAGNESIUM: Magnesium: 2.3 mg/dL (ref 1.7–2.4)

## 2021-10-28 LAB — CBC
HCT: 26.6 % — ABNORMAL LOW (ref 36.0–46.0)
Hemoglobin: 8.2 g/dL — ABNORMAL LOW (ref 12.0–15.0)
MCH: 21.1 pg — ABNORMAL LOW (ref 26.0–34.0)
MCHC: 30.8 g/dL (ref 30.0–36.0)
MCV: 68.6 fL — ABNORMAL LOW (ref 80.0–100.0)
Platelets: 422 10*3/uL — ABNORMAL HIGH (ref 150–400)
RBC: 3.88 MIL/uL (ref 3.87–5.11)
RDW: 18.9 % — ABNORMAL HIGH (ref 11.5–15.5)
WBC: 7.6 10*3/uL (ref 4.0–10.5)
nRBC: 0 % (ref 0.0–0.2)

## 2021-10-28 LAB — PHOSPHORUS: Phosphorus: 5 mg/dL — ABNORMAL HIGH (ref 2.5–4.6)

## 2021-10-28 NOTE — Progress Notes (Signed)
Patient ID: Adrienne Adkins, female   DOB: June 02, 1966, 55 y.o.   MRN: [id number]     SURGICAL PROGRESS NOTE   Hospital Day(s): 1.   Interval History: Patient seen and examined, no acute events or new complaints overnight. Patient reports continued having pain in the left lower abdomen.  Pain just under the drain.  This was the same area of the pain even before the drain was placed.  The pain has improved with pain medication.  Endorses passing gas.  Denies any fever or chills.  Vital signs in last 24 hours: [min-max] current  Temp:  [98 F (36.7 C)-98.4 F (36.9 C)] 98.4 F (36.9 C) (12/03 0808) Pulse Rate:  [59-90] 69 (12/03 0808) Resp:  [14-28] 14 (12/03 0808) BP: (93-128)/(58-88) 111/75 (12/03 0808) SpO2:  [95 %-100 %] 99 % (12/03 0808) Weight:  [81.2 kg] 81.2 kg (12/02 1952)     Height: 5\' 1"  (154.9 cm) Weight: 81.2 kg BMI (Calculated): 33.84   Physical Exam:  Constitutional: alert, cooperative and no distress  Respiratory: breathing non-labored at rest  Cardiovascular: regular rate and sinus rhythm  Gastrointestinal: soft, mild-tender, and non-distended  Labs:  CBC Latest Ref Rng & Units 10/28/2021 10/27/2021 09/25/2021  WBC 4.0 - 10.5 K/uL 7.6 12.9(H) 5.4  Hemoglobin 12.0 - 15.0 g/dL 8.2(L) 9.7(L) 9.6(L)  Hematocrit 36.0 - 46.0 % 26.6(L) 30.6(L) 29.8(L)  Platelets 150 - 400 K/uL 422(H) 458(H) 467(H)   CMP Latest Ref Rng & Units 10/28/2021 10/27/2021 09/25/2021  Glucose 70 - 99 mg/dL 80 09/27/2021) 92  BUN 6 - 20 mg/dL 12 8 17   Creatinine 0.44 - 1.00 mg/dL 381(O 1.75  Sodium 135 - 145 mmol/L 136 133(L) 138  Potassium 3.5 - 5.1 mmol/L 3.2(L) 3.5 3.6  Chloride 98 - 111 mmol/L 101 103 103  CO2 22 - 32 mmol/L 25 23 28   Calcium 8.9 - 10.3 mg/dL 1.02) 8.9 8.9  Total Protein 6.5 - 8.1 g/dL - 8.1 -  Total Bilirubin 0.3 - 1.2 mg/dL - 0.7 -  Alkaline Phos 38 - 126 U/L - 68 -  AST 15 - 41 U/L - 16 -  ALT 0 - 44 U/L - 12 -    Imaging studies: I personally evaluated the images of the  CT-guided drainage of intra-abdominal abscess.  Drain seems to be in place.   Assessment/Plan:  55 y.o. female with complicated diverticulitis with abscess s/p CT-guided percutaneous drainage, complicated by pertinent comorbidities including hypertension, obesity.  Patient with complicated diverticulitis with intra-abdominal abscess.  He was drained percutaneously yesterday.  White blood cells today with decreasing trend.  There has been no fever.  Adequate vital signs.  Still with some significant pain that improved with pain medication.  Plan is to continue IV antibiotic therapy, pain medication.  I will start her on clear liquid diet and advance slowly as patient tolerates and pain improve.  I encouraged the patient to ambulate.  We will continue with DVT prophylaxis  , MD

## 2021-10-28 NOTE — Plan of Care (Signed)

## 2021-10-29 LAB — PHOSPHORUS: Phosphorus: 4.4 mg/dL (ref 2.5–4.6)

## 2021-10-29 LAB — BASIC METABOLIC PANEL
Anion gap: 7 (ref 5–15)
BUN: 7 mg/dL (ref 6–20)
CO2: 28 mmol/L (ref 22–32)
Calcium: 8.6 mg/dL — ABNORMAL LOW (ref 8.9–10.3)
Chloride: 99 mmol/L (ref 98–111)
Creatinine, Ser: 0.71 mg/dL (ref 0.44–1.00)
GFR, Estimated: >60 mL/min (ref >60)
Glucose, Bld: 84 mg/dL (ref 70–99)
Potassium: 3.5 mmol/L (ref 3.5–5.1)
Sodium: 134 mmol/L — ABNORMAL LOW (ref 135–145)

## 2021-10-29 LAB — CBC
HCT: 27.4 % — ABNORMAL LOW (ref 36.0–46.0)
Hemoglobin: 8.7 g/dL — ABNORMAL LOW (ref 12.0–15.0)
MCH: 21.8 pg — ABNORMAL LOW (ref 26.0–34.0)
MCHC: 31.8 g/dL (ref 30.0–36.0)
MCV: 68.5 fL — ABNORMAL LOW (ref 80.0–100.0)
Platelets: 456 10*3/uL — ABNORMAL HIGH (ref 150–400)
RBC: 4 MIL/uL (ref 3.87–5.11)
RDW: 18.5 % — ABNORMAL HIGH (ref 11.5–15.5)
WBC: 5.3 10*3/uL (ref 4.0–10.5)
nRBC: 0 % (ref 0.0–0.2)

## 2021-10-29 LAB — MAGNESIUM: Magnesium: 2.4 mg/dL (ref 1.7–2.4)

## 2021-10-29 NOTE — Progress Notes (Signed)
Patient ID: Adrienne Adkins, female   DOB: 06/19/1966, 55 y.o.   MRN: [id number]     SURGICAL PROGRESS NOTE   Hospital Day(s): 2.   Interval History: Patient seen and examined, no acute events or new complaints overnight. Patient reports feeling better today.  Pain has improved.  Pain is just localized to the drain area.  She endorses she did well with clear liquids.  Denies any nausea or vomiting.  Denies any worsening pain.  Vital signs in last 24 hours: [min-max] current  Temp:  [97.7 F (36.5 C)-98.3 F (36.8 C)] 98.3 F (36.8 C) (12/04 0815) Pulse Rate:  [65-68] 68 (12/04 0815) Resp:  [14-16] 16 (12/04 0815) BP: (107-120)/(71-86) 114/86 (12/04 0815) SpO2:  [98 %-100 %] 100 % (12/04 0815)     Height: 5\' 1"  (154.9 cm) Weight: 81.2 kg BMI (Calculated): 33.84   Physical Exam:  Constitutional: alert, cooperative and no distress  Respiratory: breathing non-labored at rest  Cardiovascular: regular rate and sinus rhythm  Gastrointestinal: soft, mild-tender, and non-distended  Labs:  CBC Latest Ref Rng & Units 10/29/2021 10/28/2021 10/27/2021  WBC 4.0 - 10.5 K/uL 5.3 7.6 12.9(H)  Hemoglobin 12.0 - 15.0 g/dL 14/12/2020) 3.8(G) 5.3(M)  Hematocrit 36.0 - 46.0 % 27.4(L) 26.6(L) 30.6(L)  Platelets 150 - 400 K/uL 456(H) 422(H) 458(H)   CMP Latest Ref Rng & Units 10/29/2021 10/28/2021 10/27/2021  Glucose 70 - 99 mg/dL 84 80 14/12/2020)  BUN 6 - 20 mg/dL 7 12 8   Creatinine 0.44 - 1.00 mg/dL 032(Z 2.24  Sodium 135 - 145 mmol/L 134(L) 136 133(L)  Potassium 3.5 - 5.1 mmol/L 3.5 3.2(L) 3.5  Chloride 98 - 111 mmol/L 99 101 103  CO2 22 - 32 mmol/L 28 25 23   Calcium 8.9 - 10.3 mg/dL 8.25) 0.03) 8.9  Total Protein 6.5 - 8.1 g/dL - - 8.1  Total Bilirubin 0.3 - 1.2 mg/dL - - 0.7  Alkaline Phos 38 - 126 U/L - - 68  AST 15 - 41 U/L - - 16  ALT 0 - 44 U/L - - 12    Imaging studies: No new pertinent imaging studies   Assessment/Plan:  55 y.o. female with complicated diverticulitis with abscess s/p  CT-guided percutaneous drainage, complicated by pertinent comorbidities including hypertension, obesity.  Patient responding well to antibiotic therapy and drain.  Drain output is serous/bloody.  Vitals are continue to decrease.  Pain is improved today.  We will advance diet to full liquids.  We will assess diet toleration.  I encouraged the patient to ambulate.  7.0(W, MD

## 2021-10-29 NOTE — Progress Notes (Signed)
Patient ID: Adrienne Adkins, female   DOB: 1966/02/09, 55 y.o.   MRN: [id number]  WORK NOTE  To whom it may concern:  Patient has been admitted in [facility] since 10/27/21 for the treatment of diverticulitis. Patient has been receiving IV antibiotics and had procedure done for drainage of abscess. Patient will be admitted for this treatment until further notice.    Sincerely,  Carolan Shiver, MD

## 2021-10-29 NOTE — Plan of Care (Signed)

## 2021-10-30 LAB — CBC
HCT: 29.4 % — ABNORMAL LOW (ref 36.0–46.0)
Hemoglobin: 9 g/dL — ABNORMAL LOW (ref 12.0–15.0)
MCH: 20.6 pg — ABNORMAL LOW (ref 26.0–34.0)
MCHC: 30.6 g/dL (ref 30.0–36.0)
MCV: 67.4 fL — ABNORMAL LOW (ref 80.0–100.0)
Platelets: 507 10*3/uL — ABNORMAL HIGH (ref 150–400)
RBC: 4.36 MIL/uL (ref 3.87–5.11)
RDW: 18.6 % — ABNORMAL HIGH (ref 11.5–15.5)
WBC: 4.1 10*3/uL (ref 4.0–10.5)
nRBC: 0 % (ref 0.0–0.2)

## 2021-10-30 LAB — BASIC METABOLIC PANEL
Anion gap: 5 (ref 5–15)
BUN: 6 mg/dL (ref 6–20)
CO2: 28 mmol/L (ref 22–32)
Calcium: 8.7 mg/dL — ABNORMAL LOW (ref 8.9–10.3)
Chloride: 105 mmol/L (ref 98–111)
Creatinine, Ser: 0.63 mg/dL (ref 0.44–1.00)
GFR, Estimated: >60 mL/min (ref >60)
Glucose, Bld: 92 mg/dL (ref 70–99)
Potassium: 3.4 mmol/L — ABNORMAL LOW (ref 3.5–5.1)
Sodium: 138 mmol/L (ref 135–145)

## 2021-10-30 LAB — MAGNESIUM: Magnesium: 2.3 mg/dL (ref 1.7–2.4)

## 2021-10-30 LAB — PHOSPHORUS: Phosphorus: 4.4 mg/dL (ref 2.5–4.6)

## 2021-10-30 MED ORDER — POLYETHYLENE GLYCOL 3350 17 G PO PACK
17.0000 g | PACK | Freq: Every day | ORAL | Status: DC | PRN
  Administered 2021-10-30: 17 g via ORAL
  Filled 2021-10-30: qty 1

## 2021-10-30 NOTE — Progress Notes (Signed)
Order received from Dr Tonna Boehringer for miralax

## 2021-10-30 NOTE — Progress Notes (Signed)
Subjective:  CC: Adrienne Adkins is a 55 y.o. female  Hospital stay day 3,   diverticulitis, recurrent  HPI: No acute issues overnight.  Still having some pain and burning at drain site.  Tolerating soft diet.  ROS:  General: Denies weight loss, weight gain, fatigue, fevers, chills, and night sweats. Heart: Denies chest pain, palpitations, racing heart, irregular heartbeat, leg pain or swelling, and decreased activity tolerance. Respiratory: Denies breathing difficulty, shortness of breath, wheezing, cough, and sputum. GI: Denies change in appetite, heartburn, nausea, vomiting, constipation, diarrhea, and blood in stool. GU: Denies difficulty urinating, pain with urinating, urgency, frequency, blood in urine.   Objective:   Temp:  [98.1 F (36.7 C)-98.5 F (36.9 C)] 98.5 F (36.9 C) (12/05 0844) Pulse Rate:  [66-71] 67 (12/05 0844) Resp:  [17-20] 17 (12/05 0844) BP: (94-136)/(68-86) 136/86 (12/05 0844) SpO2:  [97 %-99 %] 99 % (12/05 0844)     Height: 5\' 1"  (154.9 cm) Weight: 81.2 kg BMI (Calculated): 33.84   Intake/Output this shift:   Intake/Output Summary (Last 24 hours) at 10/30/2021 1218 Last data filed at 10/30/2021 1040 Gross per 24 hour  Intake 1461.04 ml  Output 1340 ml  Net 121.04 ml    Constitutional :  alert, cooperative, appears stated age, and no distress  Respiratory:  clear to auscultation bilaterally  Cardiovascular:  regular rate and rhythm  Gastrointestinal: Soft, guarding around drain site and LLQ, moderate but improved compared to admission .   Skin: Cool and moist.   Psychiatric: Normal affect, non-agitated, not confused       LABS:  CMP Latest Ref Rng & Units 10/30/2021 10/29/2021 10/28/2021  Glucose 70 - 99 mg/dL 92 84 80  BUN 6 - 20 mg/dL 6 7 12   Creatinine 0.44 - 1.00 mg/dL 14/01/2021 4.94  Sodium 135 - 145 mmol/L 138 134(L) 136  Potassium 3.5 - 5.1 mmol/L 3.4(L) 3.5 3.2(L)  Chloride 98 - 111 mmol/L 105 99 101  CO2 22 - 32 mmol/L 28 28 25   Calcium  8.9 - 10.3 mg/dL 4.96) 7.59) )  Total Protein 6.5 - 8.1 g/dL - - -  Total Bilirubin 0.3 - 1.2 mg/dL - - -  Alkaline Phos 38 - 126 U/L - - -  AST 15 - 41 U/L - - -  ALT 0 - 44 U/L - - -   CBC Latest Ref Rng & Units 10/30/2021 10/29/2021 10/28/2021  WBC 4.0 - 10.5 K/uL 4.1 5.3 7.6  Hemoglobin 12.0 - 15.0 g/dL 9.0(L) 8.7(L) 8.2(L)  Hematocrit 36.0 - 46.0 % 29.4(L) 27.4(L) 26.6(L)  Platelets 150 - 400 K/uL 507(H) 456(H) 422(H)    RADS: N/a Assessment:   S/p recurrent diverticulitis with abscess, drain placed by IR.  Tolerating soft foods, with some pain that continues to improve. Will monitor to one more day to ensure no issues and also arrange changing IR bag to smaller one in anticipation for d/c tomorrow.    Pt still interested in interval sigmoidectomy, 4-6wks out. Discussed outpt f/u with IR and drain interrogation prior to proceeding with resection.

## 2021-10-30 NOTE — Progress Notes (Signed)
Patient instructed on how to flush drain and change dressing. She will need reinforcement with this

## 2021-10-31 MED ORDER — TRAMADOL HCL 50 MG PO TABS: 50.0000 mg | ORAL_TABLET | Freq: Four times a day (QID) | ORAL | 0 refills | Status: DC | PRN

## 2021-10-31 MED ORDER — ACETAMINOPHEN 325 MG PO TABS: 650.0000 mg | ORAL_TABLET | Freq: Three times a day (TID) | ORAL | 0 refills | Status: AC | PRN

## 2021-10-31 MED ORDER — IBUPROFEN 800 MG PO TABS: 800.0000 mg | ORAL_TABLET | Freq: Three times a day (TID) | ORAL | 0 refills | Status: DC | PRN

## 2021-10-31 MED ORDER — CIPROFLOXACIN HCL 500 MG PO TABS: 500.0000 mg | ORAL_TABLET | Freq: Two times a day (BID) | ORAL | 0 refills | Status: AC

## 2021-10-31 MED ORDER — NORMAL SALINE FLUSH 0.9 % IV SOLN
3.0000 mL | Freq: Two times a day (BID) | INTRAVENOUS | 0 refills | Status: AC
  Filled 2021-11-22: qty 600, 30d supply, fill #0

## 2021-10-31 MED ORDER — METRONIDAZOLE 500 MG PO TABS: 500.0000 mg | ORAL_TABLET | Freq: Three times a day (TID) | ORAL | 0 refills | Status: AC

## 2021-10-31 NOTE — Progress Notes (Signed)
JP drain expanded with brick-red fluid noted in tubing; Orders stated that JP was "not to be compressed and to be capped".  Irrigated X 2 overnight with a drop noted in bulb; not draining until opened for irrigation and then it drips from the tubing. Irrigated with 5 ml, but patient only tolerates 4 mls.  Experiences pain after 4 mls. Windy Carina, RN 7:56 AM 10/31/2021

## 2021-10-31 NOTE — Progress Notes (Signed)
Patient discharged home with discharge instructions and prescriptions. All questions and concerns answered. JP bulb changed to standard drainage bag with leg bag and teaching done with patient. No s/s of pain/distress.

## 2021-10-31 NOTE — Discharge Instructions (Signed)
Continue flushing drain as instructed by RN.  Call radiology office with any issues with drain including leakage, pain, dressing issues.

## 2021-10-31 NOTE — Discharge Summary (Signed)
Physician Discharge Summary  Patient ID: Alisi Lupien MRN: [id number] DOB/AGE: August 09, 1966 55 y.o.  Admit date: 10/27/2021 Discharge date: 10/31/21  Admission Diagnoses: diverticulitis  Discharge Diagnoses:  Same as above  Discharged Condition: good  Hospital Course: admitted for above. Underwent IR guided drainage for abscess present on CT.  Post procedure, recovered with resolution of abdominal pain, tolerating regular diet. At time of d/c, tolerating diet and pain controlled at drain insertion site, with normal labs, and minimal output from drain.  F/u as oupt for discussion of interval sigmoidectomy.  Further drain care with IR in the meantime.  Consults: IR  Discharge Exam: Blood pressure (!) 137/94, pulse 66, temperature 97.8 F (36.6 C), temperature source Oral, resp. rate 18, height 5\' 1"  (1.549 m), weight 81.2 kg, SpO2 100 %. General appearance: alert, cooperative, and no distress GI: soft, no guarding, TTP at drain insertion site, but remainder of abdomen benign.  Disposition:  Discharge disposition: 01-Home or Self Care       Discharge Instructions     Discharge patient   Complete by: As directed    Discharge disposition: 01-Home or Self Care   Discharge patient date: 10/31/2021      Allergies as of 10/31/2021       Reactions   Beta Adrenergic Blockers Other (See Comments)   Prolonged P waves on EKG   Amoxicillin Hives, Rash   Sulfa Antibiotics Hives        Medication List     STOP taking these medications    Clenpiq 10-3.5-12 MG-GM -GM/160ML Soln Generic drug: Sod Picosulfate-Mag Ox-Cit Acd   Sutab 580 313 1432 MG Tabs Generic drug: Sodium Sulfate-Mag Sulfate-KCl       TAKE these medications    acetaminophen 325 MG tablet Commonly known as: Tylenol Take 2 tablets (650 mg total) by mouth every 8 (eight) hours as needed for mild pain.   amLODipine 2.5 MG tablet Commonly known as: NORVASC Take 2.5 mg by mouth daily.   ciprofloxacin  500 MG tablet Commonly known as: CIPRO Take 1 tablet (500 mg total) by mouth 2 (two) times daily for 5 days.   dicyclomine 10 MG capsule Commonly known as: BENTYL TAKE 1 CAPSULE BY MOUTH 4 TIMES DAILY AS NEEDED FOR UP TO 14 DAYS FOR SPASMS (STOMACH CRAMPS).   ibuprofen 800 MG tablet Commonly known as: ADVIL Take 1 tablet (800 mg total) by mouth every 8 (eight) hours as needed for mild pain or moderate pain.   meloxicam 15 MG tablet Commonly known as: MOBIC Take 15 mg by mouth daily.   metroNIDAZOLE 500 MG tablet Commonly known as: Flagyl Take 1 tablet (500 mg total) by mouth 3 (three) times daily for 5 days.   pantoprazole 40 MG tablet Commonly known as: PROTONIX Take 1 tablet (40 mg total) by mouth 2 (two) times daily.   pantoprazole 40 MG tablet Commonly known as: PROTONIX Take 1 tablet by mouth daily.   sodium chloride flush 0.9 % Soln Commonly known as: NS 3 mLs by Intracatheter route every 12 (twelve) hours.   traMADol 50 MG tablet Commonly known as: Ultram Take 1 tablet (50 mg total) by mouth every 6 (six) hours as needed.   valsartan-hydrochlorothiazide 320-25 MG tablet Commonly known as: DIOVAN-HCT Take 1 tablet by mouth daily.        Follow-up Information     [place], Aricela Bertagnolli, DO. Go on 11/13/2021.   Specialty: Surgery Why: discuss diverticulitis surgery.  must be AFTER radiology appointment8:40am appointment Contact information: [address]  [place] [address] [phone]         [facility] Go on 11/10/2021.   Why: drain interrogation for IR placed drain for diverticulitis 8:30 in [place] Contact information: [address] [place] [address] [phone]                  Total time spent arranging discharge was >9min. Signed: Sung Amabile 10/31/2021, 1:20 PM

## 2021-11-01 LAB — AEROBIC/ANAEROBIC CULTURE W GRAM STAIN (SURGICAL/DEEP WOUND): Gram Stain: NONE SEEN

## 2021-11-10 ENCOUNTER — Other Ambulatory Visit: Payer: Self-pay | Admitting: Physician Assistant

## 2021-11-10 ENCOUNTER — Emergency Department: Payer: PRIVATE HEALTH INSURANCE

## 2021-11-10 ENCOUNTER — Encounter: Payer: Self-pay | Admitting: Emergency Medicine

## 2021-11-10 ENCOUNTER — Emergency Department
Admission: EM | Admit: 2021-11-10 | Discharge: 2021-11-10 | Disposition: A | Discharge status: Home / Self Care | Payer: PRIVATE HEALTH INSURANCE | Attending: Emergency Medicine | Admitting: Emergency Medicine

## 2021-11-10 ENCOUNTER — Ambulatory Visit: Admission: RE | Admit: 2021-11-10 | Payer: PRIVATE HEALTH INSURANCE | Source: Ambulatory Visit

## 2021-11-10 ENCOUNTER — Other Ambulatory Visit: Payer: Self-pay

## 2021-11-10 ENCOUNTER — Inpatient Hospital Stay
Admission: RE | Admit: 2021-11-10 | Discharge: 2021-11-10 | Disposition: A | Discharge status: Home / Self Care | Payer: PRIVATE HEALTH INSURANCE | Source: Ambulatory Visit | Attending: Physician Assistant | Admitting: Physician Assistant

## 2021-11-10 ENCOUNTER — Ambulatory Visit: Payer: PRIVATE HEALTH INSURANCE

## 2021-11-10 DIAGNOSIS — Z8616 Personal history of COVID-19: Secondary | ICD-10-CM | POA: Diagnosis not present

## 2021-11-10 DIAGNOSIS — R1032 Left lower quadrant pain: Secondary | ICD-10-CM | POA: Insufficient documentation

## 2021-11-10 DIAGNOSIS — F17210 Nicotine dependence, cigarettes, uncomplicated: Secondary | ICD-10-CM | POA: Insufficient documentation

## 2021-11-10 DIAGNOSIS — I10 Essential (primary) hypertension: Secondary | ICD-10-CM | POA: Diagnosis not present

## 2021-11-10 DIAGNOSIS — K5720 Diverticulitis of large intestine with perforation and abscess without bleeding: Secondary | ICD-10-CM

## 2021-11-10 DIAGNOSIS — R000 Tachycardia, unspecified: Secondary | ICD-10-CM | POA: Diagnosis not present

## 2021-11-10 DIAGNOSIS — Z20822 Contact with and (suspected) exposure to covid-19: Secondary | ICD-10-CM | POA: Insufficient documentation

## 2021-11-10 DIAGNOSIS — R509 Fever, unspecified: Secondary | ICD-10-CM

## 2021-11-10 DIAGNOSIS — T7840XA Allergy, unspecified, initial encounter: Secondary | ICD-10-CM | POA: Diagnosis not present

## 2021-11-10 DIAGNOSIS — R21 Rash and other nonspecific skin eruption: Secondary | ICD-10-CM | POA: Insufficient documentation

## 2021-11-10 HISTORY — PX: IR SINUS/FIST TUBE CHK-NON GI: IMG673

## 2021-11-10 LAB — COMPREHENSIVE METABOLIC PANEL
ALT: 29 U/L (ref 0–44)
AST: 37 U/L (ref 15–41)
Albumin: 4.2 g/dL (ref 3.5–5.0)
Alkaline Phosphatase: 102 U/L (ref 38–126)
Anion gap: 9 (ref 5–15)
BUN: 14 mg/dL (ref 6–20)
CO2: 26 mmol/L (ref 22–32)
Calcium: 9.2 mg/dL (ref 8.9–10.3)
Chloride: 99 mmol/L (ref 98–111)
Creatinine, Ser: 0.66 mg/dL (ref 0.44–1.00)
GFR, Estimated: >60 mL/min (ref >60)
Glucose, Bld: 103 mg/dL — ABNORMAL HIGH (ref 70–99)
Potassium: 3.1 mmol/L — ABNORMAL LOW (ref 3.5–5.1)
Sodium: 134 mmol/L — ABNORMAL LOW (ref 135–145)
Total Bilirubin: 0.6 mg/dL (ref 0.3–1.2)
Total Protein: 8.4 g/dL — ABNORMAL HIGH (ref 6.5–8.1)

## 2021-11-10 LAB — LIPASE, BLOOD: Lipase: 33 U/L (ref 11–51)

## 2021-11-10 LAB — RESP PANEL BY RT-PCR (FLU A&B, COVID) ARPGX2
Influenza A by PCR: NEGATIVE
Influenza B by PCR: NEGATIVE
SARS Coronavirus 2 by RT PCR: NEGATIVE

## 2021-11-10 LAB — CBC WITH DIFFERENTIAL/PLATELET
Abs Immature Granulocytes: 0.03 10*3/uL (ref 0.00–0.07)
Basophils Absolute: 0 10*3/uL (ref 0.0–0.1)
Basophils Relative: 0 %
Eosinophils Absolute: 0.3 10*3/uL (ref 0.0–0.5)
Eosinophils Relative: 4 %
HCT: 34 % — ABNORMAL LOW (ref 36.0–46.0)
Hemoglobin: 10.6 g/dL — ABNORMAL LOW (ref 12.0–15.0)
Immature Granulocytes: 0 %
Lymphocytes Relative: 9 %
Lymphs Abs: 0.7 10*3/uL (ref 0.7–4.0)
MCH: 20.8 pg — ABNORMAL LOW (ref 26.0–34.0)
MCHC: 31.2 g/dL (ref 30.0–36.0)
MCV: 66.7 fL — ABNORMAL LOW (ref 80.0–100.0)
Monocytes Absolute: 0.4 10*3/uL (ref 0.1–1.0)
Monocytes Relative: 6 %
Neutro Abs: 6.4 10*3/uL (ref 1.7–7.7)
Neutrophils Relative %: 81 %
Platelets: 429 10*3/uL — ABNORMAL HIGH (ref 150–400)
RBC: 5.1 MIL/uL (ref 3.87–5.11)
RDW: 19.4 % — ABNORMAL HIGH (ref 11.5–15.5)
WBC: 8 10*3/uL (ref 4.0–10.5)
nRBC: 0 % (ref 0.0–0.2)

## 2021-11-10 LAB — LACTIC ACID, PLASMA: Lactic Acid, Venous: 1.1 mmol/L (ref 0.5–1.9)

## 2021-11-10 MED ORDER — IOHEXOL 350 MG/ML SOLN
10.0000 mL | Freq: Once | INTRAVENOUS | Status: AC | PRN
  Administered 2021-11-10: 10 mL
  Filled 2021-11-10: qty 10

## 2021-11-10 MED ORDER — EPINEPHRINE 0.3 MG/0.3ML IJ SOAJ
0.3000 mg | Freq: Once | INTRAMUSCULAR | Status: AC
  Administered 2021-11-10: 0.3 mg via INTRAMUSCULAR
  Filled 2021-11-10: qty 0.3

## 2021-11-10 MED ORDER — DIPHENHYDRAMINE HCL 50 MG/ML IJ SOLN
50.0000 mg | Freq: Once | INTRAMUSCULAR | Status: AC
  Administered 2021-11-10: 50 mg via INTRAVENOUS
  Filled 2021-11-10: qty 1

## 2021-11-10 MED ORDER — LACTATED RINGERS IV BOLUS
1000.0000 mL | Freq: Once | INTRAVENOUS | Status: AC
  Administered 2021-11-10: 1000 mL via INTRAVENOUS

## 2021-11-10 MED ORDER — IOHEXOL 300 MG/ML  SOLN
100.0000 mL | Freq: Once | INTRAMUSCULAR | Status: AC | PRN
  Administered 2021-11-10: 100 mL via INTRAVENOUS

## 2021-11-10 MED ORDER — FAMOTIDINE 20 MG PO TABS: 20.0000 mg | ORAL_TABLET | Freq: Two times a day (BID) | ORAL | 0 refills | Status: DC

## 2021-11-10 MED ORDER — EPINEPHRINE 0.3 MG/0.3ML IJ SOAJ: 0.3000 mg | INTRAMUSCULAR | 0 refills | Status: AC | PRN

## 2021-11-10 MED ORDER — DIPHENHYDRAMINE HCL 25 MG PO TABS: ORAL_TABLET | ORAL | 0 refills | Status: DC

## 2021-11-10 MED ORDER — ACETAMINOPHEN 500 MG PO TABS
1000.0000 mg | ORAL_TABLET | Freq: Once | ORAL | Status: AC
  Administered 2021-11-10: 1000 mg via ORAL
  Filled 2021-11-10: qty 2

## 2021-11-10 MED ORDER — METHYLPREDNISOLONE SODIUM SUCC 125 MG IJ SOLR
125.0000 mg | Freq: Once | INTRAMUSCULAR | Status: AC
  Administered 2021-11-10: 125 mg via INTRAVENOUS
  Filled 2021-11-10: qty 2

## 2021-11-10 MED ORDER — MOXIFLOXACIN HCL 400 MG PO TABS: 400.0000 mg | ORAL_TABLET | Freq: Every day | ORAL | 0 refills | Status: AC

## 2021-11-10 MED ORDER — FAMOTIDINE IN NACL 20-0.9 MG/50ML-% IV SOLN
20.0000 mg | Freq: Once | INTRAVENOUS | Status: AC
  Administered 2021-11-10: 20 mg via INTRAVENOUS
  Filled 2021-11-10: qty 50

## 2021-11-10 NOTE — ED Notes (Signed)
Pt refused Tylenol at this time. Pt also refusing Ibuprofen. Pt states that she wanted to wait to figure out what is causing her Allergic Reaction before she takes anything.

## 2021-11-10 NOTE — ED Provider Notes (Signed)
[facility] Emergency Department Provider Note  ____________________________________________   Event Date/Time   First MD Initiated Contact with Patient 11/10/21 [phone]     (approximate)  I have reviewed the triage vital signs and the nursing notes.   HISTORY  Chief Complaint Allergic Reaction    HPI Adrienne Adkins is a 56 y.o. female  with HTN, recent diverticulitis s/p repair, here with rash, SOB, fatigue. Pt reports that her sx began yesterday as diffuse pruritic body rash. Started on her abdomen and has now spread throughout her body, worsening. She has now had worsening subjective tongue swelling, SOB, and feeling like her throat is closing. She's had associated cough, mild increase in LE abd pain, and now fever. Has been having normal BMs. No known sick contacts. Drainage from IR drain has been improving/minimal. She was on ABX for her diverticulitis but stopped these several days ago. Has been taking nsaids for pain. No other new exposures/drugs.       Past Medical History:  Diagnosis Date   Carpal tunnel syndrome    COVID-19 06/2019   Dermatitis    Diverticular disease    GERD (gastroesophageal reflux disease)    History of kidney stones    Hypertension    IGT (impaired glucose tolerance)    Lumbago    Migraines    Palpitations    Vertigo     Patient Active Problem List   Diagnosis Date Noted   Diverticulitis large intestine 09/20/2021   Obesity (BMI 30-39.9) 08/02/2021   Acute blood loss anemia 07/04/2021   Abdominal pain 07/04/2021   Intra-abdominal abscess ([place]) 07/04/2021   Diverticulitis 07/04/2021   Acute prerenal azotemia 07/04/2021   GERD (gastroesophageal reflux disease)    Hypertension    Acute lower GI bleeding 07/03/2021   Gluteal abscess 02/02/2021   IGT (impaired glucose tolerance) 11/27/2011    Past Surgical History:  Procedure Laterality Date   ABDOMINAL HYSTERECTOMY     APPENDECTOMY     COLONOSCOPY      INCISION AND DRAINAGE ABSCESS Left 02/02/2021   Procedure: INCISION AND DRAINAGE ABSCESS;  Surgeon: Benjamine Sprague, DO;  Location: [facility] ORS;  Service: General;  Laterality: Left;  IODOFORM PACKING    INDUCED ABORTION     OOPHORECTOMY      Prior to Admission medications   Medication Sig Start Date End Date Taking? Authorizing Provider  diphenhydrAMINE (BENADRYL) 25 MG tablet Take 2 tablets by mouth every 6 hours for 24 hours, then every 8 hours for 24 hours, then as needed. 11/10/21  Yes Duffy Bruce, MD  EPINEPHrine 0.3 mg/0.3 mL IJ SOAJ injection Inject 0.3 mg into the muscle as needed for anaphylaxis. 11/10/21  Yes Duffy Bruce, MD  famotidine (PEPCID) 20 MG tablet Take 1 tablet (20 mg total) by mouth 2 (two) times daily for 3 days. 11/10/21 11/13/21 Yes Duffy Bruce, MD  moxifloxacin (AVELOX) 400 MG tablet Take 1 tablet (400 mg total) by mouth daily for 7 days. 11/10/21 11/17/21 Yes Duffy Bruce, MD  acetaminophen (TYLENOL) 325 MG tablet Take 2 tablets (650 mg total) by mouth every 8 (eight) hours as needed for mild pain. 10/31/21 11/30/21  Lysle Pearl, Isami, DO  amLODipine (NORVASC) 2.5 MG tablet Take 2.5 mg by mouth daily. 10/11/16   [provider]  dicyclomine (BENTYL) 10 MG capsule TAKE 1 CAPSULE BY MOUTH 4 TIMES DAILY AS NEEDED FOR UP TO 14 DAYS FOR SPASMS (STOMACH CRAMPS). 07/05/21   [provider]  ibuprofen (ADVIL) 800 MG  tablet Take 1 tablet (800 mg total) by mouth every 8 (eight) hours as needed for mild pain or moderate pain. 10/31/21   Lysle Pearl, Isami, DO  meloxicam (MOBIC) 15 MG tablet Take 15 mg by mouth daily. 08/15/21   [provider]  pantoprazole (PROTONIX) 40 MG tablet Take 1 tablet by mouth daily. 08/08/21   [provider]  sodium chloride flush (NS) 0.9 % SOLN 3 mLs by Intracatheter route every 12 (twelve) hours. 10/31/21 11/30/21  Benjamine Sprague, DO  traMADol (ULTRAM) 50 MG tablet Take 1 tablet (50 mg total) by mouth every 6 (six) hours as  needed. 10/31/21 10/31/22  Lysle Pearl, Isami, DO  valsartan-hydrochlorothiazide (DIOVAN-HCT) 320-25 MG tablet Take 1 tablet by mouth daily.     [provider]    Allergies Beta adrenergic blockers, Amoxicillin, and Sulfa antibiotics  Family History  Problem Relation Age of Onset   Breast cancer Maternal Aunt     Social History Social History   Tobacco Use   Smoking status: Every Day    Types: Cigarettes   Smokeless tobacco: Never  Vaping Use   Vaping Use: Never used  Substance Use Topics   Alcohol use: Yes    Comment: social   Drug use: No    Review of Systems  Review of Systems  Constitutional:  Positive for chills, fatigue and fever.  HENT:  Positive for trouble swallowing. Negative for congestion and sore throat.   Eyes:  Negative for visual disturbance.  Respiratory:  Positive for cough. Negative for shortness of breath.   Cardiovascular:  Negative for chest pain.  Gastrointestinal:  Positive for abdominal pain. Negative for diarrhea, nausea and vomiting.  Genitourinary:  Negative for flank pain.  Musculoskeletal:  Negative for back pain and neck pain.  Skin:  Positive for rash. Negative for wound.  Neurological:  Negative for weakness.  All other systems reviewed and are negative.   ____________________________________________  PHYSICAL EXAM:      VITAL SIGNS: ED Triage Vitals  Enc Vitals Group     BP 11/10/21 0831 109/66     Pulse Rate 11/10/21 0831 (!) 102     Resp 11/10/21 0831 20     Temp 11/10/21 0836 (!) 101.7 F (38.7 C)     Temp Source 11/10/21 0836 Oral     SpO2 11/10/21 0831 99 %     Weight 11/10/21 0835 175 lb (79.4 kg)     Height 11/10/21 0835 5\' 1"  (1.549 m)     Head Circumference --      Peak Flow --      Pain Score 11/10/21 0835 0     Pain Loc --      Pain Edu? --      Excl. in [place]? --      Physical Exam Vitals and nursing note reviewed.  Constitutional:      General: She is not in acute distress.    Appearance: She is  well-developed.  HENT:     Head: Normocephalic and atraumatic.     Mouth/Throat:     Mouth: Mucous membranes are dry.  Eyes:     Conjunctiva/sclera: Conjunctivae normal.  Cardiovascular:     Rate and Rhythm: Regular rhythm. Tachycardia present.     Heart sounds: Normal heart sounds. No murmur heard.   No friction rub.  Pulmonary:     Effort: Pulmonary effort is normal. No respiratory distress.     Breath sounds: Normal breath sounds. No wheezing or rales.  Abdominal:  General: There is no distension.     Palpations: Abdomen is soft.     Tenderness: There is no abdominal tenderness.     Comments: LLQ drain in place, draining serous drainage  Musculoskeletal:     Cervical back: Neck supple.  Skin:    General: Skin is warm.     Capillary Refill: Capillary refill takes less than 2 seconds.     Findings: Rash (severe profuse urticaria across the face, trunk, arms, and legs) present.  Neurological:     Mental Status: She is alert and oriented to person, place, and time.     Motor: No abnormal muscle tone.      ____________________________________________   LABS (all labs ordered are listed, but only abnormal results are displayed)  Labs Reviewed  CBC WITH DIFFERENTIAL/PLATELET - Abnormal; Notable for the following components:      Result Value   Hemoglobin 10.6 (*)    HCT 34.0 (*)    MCV 66.7 (*)    MCH 20.8 (*)    RDW 19.4 (*)    Platelets 429 (*)    All other components within normal limits  COMPREHENSIVE METABOLIC PANEL - Abnormal; Notable for the following components:   Sodium 134 (*)    Potassium 3.1 (*)    Glucose, Bld 103 (*)    Total Protein 8.4 (*)    All other components within normal limits  RESP PANEL BY RT-PCR (FLU A&B, COVID) ARPGX2  CULTURE, BLOOD (ROUTINE X 2)  CULTURE, BLOOD (ROUTINE X 2)  LACTIC ACID, PLASMA  LIPASE, BLOOD  LACTIC ACID, PLASMA  URINALYSIS, ROUTINE W REFLEX MICROSCOPIC    ____________________________________________  EKG:   ________________________________________  RADIOLOGY All imaging, including plain films, CT scans, and ultrasounds, independently reviewed by me, and interpretations confirmed via formal radiology reads.  ED MD interpretation:   CXR: Clear CT A/P: Resolution of intra-abd abscess, no other complications  Official radiology report(s): CT ABDOMEN PELVIS W CONTRAST  Result Date: 11/10/2021 CLINICAL DATA:  Perforated diverticulitis, percutaneous drain placed EXAM: CT ABDOMEN AND PELVIS WITH CONTRAST TECHNIQUE: Multidetector CT imaging of the abdomen and pelvis was performed using the standard protocol following bolus administration of intravenous contrast. CONTRAST:  179mL OMNIPAQUE IOHEXOL 300 MG/ML  SOLN COMPARISON:  10/27/2021 FINDINGS: Lower chest: No acute abnormality. Hepatobiliary: No solid liver abnormality is seen. No gallstones, gallbladder wall thickening, or biliary dilatation. Pancreas: Unremarkable. No pancreatic ductal dilatation or surrounding inflammatory changes. Spleen: Normal in size without significant abnormality. Adrenals/Urinary Tract: Adrenal glands are unremarkable. Kidneys are normal, without renal calculi, solid lesion, or hydronephrosis. Thickening of the left aspect of the bladder dome. Stomach/Bowel: Stomach is within normal limits. Appendix is surgically absent. Pancolonic diverticulosis, severe in the sigmoid colon. No acute inflammatory findings at this time. Vascular/Lymphatic: Aortic atherosclerosis. No enlarged abdominal or pelvic lymph nodes. Reproductive: Status post hysterectomy. Other: No abdominal wall hernia or abnormality. No abdominopelvic ascites. Percutaneous pigtail drainage catheter is positioned adjacent to the left aspect of the bladder dome via anterior approach (series 2, image 62). There has been interval resolution of a previously seen abscess cavity within and adjacent to the urinary bladder wall. Musculoskeletal: No acute or significant osseous  findings. IMPRESSION: 1. Percutaneous pigtail drainage catheter is positioned adjacent to the left aspect of the bladder dome via anterior approach. There has been interval resolution of a previously seen abscess cavity within and adjacent to the urinary bladder wall. No new or persistent fluid collection in the abdomen or pelvis. 2. Pancolonic  diverticulosis, severe in the sigmoid colon. No acute inflammatory findings at this time. Aortic Atherosclerosis (ICD10-I70.0). Electronically Signed   By: Delanna Ahmadi M.D.   On: 11/10/2021 10:22   DG Chest Portable 1 View  Result Date: 11/10/2021 CLINICAL DATA:  Shortness of breath EXAM: PORTABLE CHEST 1 VIEW COMPARISON:  09/23/2019 FINDINGS: The heart size and mediastinal contours are within normal limits. Both lungs are clear. The visualized skeletal structures are unremarkable. IMPRESSION: No acute abnormality of the lungs in AP portable projection. Electronically Signed   By: Delanna Ahmadi M.D.   On: 11/10/2021 09:51    ____________________________________________  PROCEDURES   Procedure(s) performed (including Critical Care):  Procedures  ____________________________________________  INITIAL IMPRESSION / MDM / [place] / ED COURSE  As part of my medical decision making, I reviewed the following data within the [facility] notes reviewed and incorporated, Old chart reviewed, Notes from prior ED visits, and [place] Controlled Substance Database       *Adrienne Adkins was evaluated in Emergency Department on 11/10/2021 for the symptoms described in the history of present illness. She was evaluated in the context of the global COVID-19 pandemic, which necessitated consideration that the patient might be at risk for infection with the SARS-CoV-2 virus that causes COVID-19. Institutional protocols and algorithms that pertain to the evaluation of patients at risk for COVID-19 are in a state of rapid change based on  information released by regulatory bodies including the CDC and federal and state organizations. These policies and algorithms were followed during the patient's care in the ED.  Some ED evaluations and interventions may be delayed as a result of limited staffing during the pandemic.*     Medical Decision Making:  55 yo F here with diffuse urticarial rash, reported SOB. Suspect possible anaphylaxis 2/2 delayed drug exposure (cefepime in hospital, has allergy to penicillin), drug eruption, versus viral exanthem/urticaria. Largely resolved w/ epipen, benadryl, pepcid with no rebound. Pt has no tongue or lip ulcerations, no mucosal involvement or signs to suggest SJS/TEN or other systemic rash> Labs are remarkably reassuring with normal WBC, lactate, lfts, renal function. COVID, flu negative. Pt incidentally had a fever here - unclear etiology, pt has had +nasal congestion, cough so query viral rash and illness. CT a/p obtained given recent surgery and shows no evidence of recurrent abscess or other complications. CXR is clear. Pt is asymptomatic after anithistamines and requesting to go home.  Given resolution of allergic reaction, no recurrence of fever, and reassuring labs with no signs of sepsis, feel it's reasonable to d/c. Given her recent procedure and ? Cough, will treat with avelox as she has tolerated fluoro in past and this would cover possible early CAP as well as intra-abd remaining infection. Will d/c with good return precautions.  ____________________________________________  FINAL CLINICAL IMPRESSION(S) / ED DIAGNOSES  Final diagnoses:  Allergic reaction, initial encounter  Fever in adult     MEDICATIONS GIVEN DURING THIS VISIT:  Medications  methylPREDNISolone sodium succinate (SOLU-MEDROL) 125 mg/2 mL injection 125 mg (has no administration in time range)  lactated ringers bolus 1,000 mL (1,000 mLs Intravenous New Bag/Given 11/10/21 0945)  diphenhydrAMINE (BENADRYL) injection 50  mg (50 mg Intravenous Given 11/10/21 0945)  famotidine (PEPCID) IVPB 20 mg premix (0 mg Intravenous Stopped 11/10/21 1134)  EPINEPHrine (EPI-PEN) injection 0.3 mg (0.3 mg Intramuscular Given 11/10/21 0944)  acetaminophen (TYLENOL) tablet 1,000 mg (1,000 mg Oral Given 11/10/21 0944)  iohexol (OMNIPAQUE) 300 MG/ML solution 100 mL (  100 mLs Intravenous Contrast Given 11/10/21 1005)     ED Discharge Orders          Ordered    diphenhydrAMINE (BENADRYL) 25 MG tablet        11/10/21 1159    famotidine (PEPCID) 20 MG tablet  2 times daily        11/10/21 1159    EPINEPHrine 0.3 mg/0.3 mL IJ SOAJ injection  As needed        11/10/21 1159    moxifloxacin (AVELOX) 400 MG tablet  Daily        11/10/21 1159             Note:  This document was prepared using Dragon voice recognition software and may include unintentional dictation errors.   Shaune Pollack, MD 11/10/21 [phone]

## 2021-11-10 NOTE — ED Notes (Signed)
Pt ambulatory to waiting room. Pt verbalized understanding of discharge instructions.   

## 2021-11-10 NOTE — ED Triage Notes (Signed)
First RN Note: Pt to ED via POV from [facility] with c/o possible allergic reaction. Per [facility] staff member pt was symptoms that started last night. Per [facility] staff member pt with c/o hives, "swollen bottom lip", states patient has been taking 800mg  ibuprofen and recently had surgery with has drain in place from surgery. Pt A&O and ambulatory without difficulty to triage desk, pt able to maintain her own secretions at this time.

## 2021-11-10 NOTE — Discharge Instructions (Signed)
Take the antibiotic as prescribed  Follow-up with IR and surgery as discussed  Follow-up with PCP int he next 2-3 days  Take the antihistamines (benadryl and pepcid) as described for 2-3 days, then as needed

## 2021-11-10 NOTE — ED Notes (Signed)
Pt transported to CT ?

## 2021-11-10 NOTE — ED Triage Notes (Signed)
Pt via POV from home. Pt states she being sick since Wednesday. Pt states that she noticed itchy hives and SOB. States it is painful to swallow. Pt is A&OX4 and NAD.

## 2021-11-12 ENCOUNTER — Emergency Department
Admission: EM | Admit: 2021-11-12 | Discharge: 2021-11-12 | Disposition: A | Discharge status: Home / Self Care | Payer: PRIVATE HEALTH INSURANCE | Attending: Emergency Medicine | Admitting: Emergency Medicine

## 2021-11-12 ENCOUNTER — Encounter: Payer: Self-pay | Admitting: Emergency Medicine

## 2021-11-12 ENCOUNTER — Other Ambulatory Visit: Payer: Self-pay

## 2021-11-12 DIAGNOSIS — K219 Gastro-esophageal reflux disease without esophagitis: Secondary | ICD-10-CM | POA: Diagnosis not present

## 2021-11-12 DIAGNOSIS — F17210 Nicotine dependence, cigarettes, uncomplicated: Secondary | ICD-10-CM | POA: Insufficient documentation

## 2021-11-12 DIAGNOSIS — Z79899 Other long term (current) drug therapy: Secondary | ICD-10-CM | POA: Insufficient documentation

## 2021-11-12 DIAGNOSIS — L509 Urticaria, unspecified: Secondary | ICD-10-CM | POA: Insufficient documentation

## 2021-11-12 DIAGNOSIS — Z8616 Personal history of COVID-19: Secondary | ICD-10-CM | POA: Diagnosis not present

## 2021-11-12 DIAGNOSIS — I10 Essential (primary) hypertension: Secondary | ICD-10-CM | POA: Insufficient documentation

## 2021-11-12 MED ORDER — FAMOTIDINE 20 MG PO TABS
40.0000 mg | ORAL_TABLET | Freq: Once | ORAL | Status: AC
  Administered 2021-11-12: 11:00:00 40 mg via ORAL
  Filled 2021-11-12: qty 2

## 2021-11-12 MED ORDER — HYDROCORTISONE 1 % EX LOTN: 1.0000 "application " | TOPICAL_LOTION | Freq: Two times a day (BID) | CUTANEOUS | 0 refills | Status: DC

## 2021-11-12 MED ORDER — CETIRIZINE HCL 10 MG PO TABS: 10.0000 mg | ORAL_TABLET | Freq: Two times a day (BID) | ORAL | 0 refills | Status: DC

## 2021-11-12 MED ORDER — LORATADINE 10 MG PO TABS
10.0000 mg | ORAL_TABLET | Freq: Every day | ORAL | Status: DC
  Administered 2021-11-12: 11:00:00 10 mg via ORAL
  Filled 2021-11-12: qty 1

## 2021-11-12 MED ORDER — FAMOTIDINE 40 MG PO TABS: 40.0000 mg | ORAL_TABLET | Freq: Every day | ORAL | 0 refills | Status: DC

## 2021-11-12 NOTE — ED Notes (Signed)
Pt to ED for intractable urticaria/ hives. Has been taking Benadryl and cortisone cream for days with no relief. Seen here 2 days ago for same, with onset of rash 4 days ago. Pt states she cannot stop scratching and rash covers entire body, including chest, back, arms and legs--everywhere except soles of hands and feet. Pt is extremely uncomfortable. Red blotchy rash can be seen above shirt line.  Pt took 2 benadryl pills this morning at 0500, since then has taken nothing.  The only thing she has taken as far as different meds was 800mg  ibuprophen, prescribed upon hospital discharge 2 weeks ago, which she took for 2 days before onset of rash. Pt also was given a new abx 2 weeks ago while hospitalized but not since then.

## 2021-11-12 NOTE — ED Notes (Signed)
Pt requesting to speak with MD. MD made aware of pt's concerns.

## 2021-11-12 NOTE — ED Triage Notes (Signed)
Pt reports hives that are itchy all over her body. Pt states was seen here for the same Friday and advised if they did not get better to come back. Pt reports no better even with the medication. Pt denies SOB or feeling that her throat is closing.

## 2021-11-12 NOTE — Discharge Instructions (Addendum)
You have hives.  Please start taking Zyrtec 10 mg twice a day and 40 mg of Pepcid which can help treat your itching and the hives.  Please follow-up with dermatology.

## 2021-11-12 NOTE — ED Provider Notes (Signed)
[facility]  ____________________________________________   Event Date/Time   First MD Initiated Contact with Patient 11/12/21 1005     (approximate)  I have reviewed the triage vital signs and the nursing notes.   HISTORY  Chief Complaint Urticaria    HPI Adrienne Adkins is a 55 y.o. female with past medical history of diverticulitis, HTN, migraines, GERD who presents with urticaria.  Patient was seen 2 days ago for similar.  Patient was also recently admitted for complicated diverticulitis and had drain placed by IR.  She has history of allergy to amoxicillin and did receive cefepime while in the hospital but has not had any ongoing penicillin use.  Patient was given anaphylaxis treatment while in the emergency department 2 days ago due to some respiratory symptoms.  She ultimately discharged after Solu-Medrol epi Pepcid and Benadryl.  She was discharged with Benadryl and Pepcid.  Her symptoms have continued.  She has ongoing urticaria diffusely.  Denies oral lesions.  She is not been able to sleep secondary to itch.  Denies respiratory symptoms.         Past Medical History:  Diagnosis Date   Carpal tunnel syndrome    COVID-19 06/2019   Dermatitis    Diverticular disease    GERD (gastroesophageal reflux disease)    History of kidney stones    Hypertension    IGT (impaired glucose tolerance)    Lumbago    Migraines    Palpitations    Vertigo     Patient Active Problem List   Diagnosis Date Noted   Diverticulitis large intestine 09/20/2021   Obesity (BMI 30-39.9) 08/02/2021   Acute blood loss anemia 07/04/2021   Abdominal pain 07/04/2021   Intra-abdominal abscess (HCC) 07/04/2021   Diverticulitis 07/04/2021   Acute prerenal azotemia 07/04/2021   GERD (gastroesophageal reflux disease)    Hypertension    Acute lower GI bleeding 07/03/2021   Gluteal abscess 02/02/2021   IGT (impaired glucose tolerance) 11/27/2011    Past Surgical History:   Procedure Laterality Date   ABDOMINAL HYSTERECTOMY     APPENDECTOMY     COLONOSCOPY     INCISION AND DRAINAGE ABSCESS Left 02/02/2021   Procedure: INCISION AND DRAINAGE ABSCESS;  Surgeon: Sung Amabile, DO;  Location: [facility] ORS;  Service: General;  Laterality: Left;  IODOFORM PACKING    INDUCED ABORTION     IR SINUS/FIST TUBE CHK-NON GI  11/10/2021   OOPHORECTOMY      Prior to Admission medications   Medication Sig Start Date End Date Taking? Authorizing Provider  cetirizine (ZYRTEC ALLERGY) 10 MG tablet Take 1 tablet (10 mg total) by mouth 2 (two) times daily. 11/12/21 12/12/21 Yes Georga Hacking, MD  famotidine (PEPCID) 40 MG tablet Take 1 tablet (40 mg total) by mouth daily. 11/12/21 12/12/21 Yes Georga Hacking, MD  acetaminophen (TYLENOL) 325 MG tablet Take 2 tablets (650 mg total) by mouth every 8 (eight) hours as needed for mild pain. 10/31/21 11/30/21  Tonna Boehringer, Isami, DO  amLODipine (NORVASC) 2.5 MG tablet Take 2.5 mg by mouth daily. 10/11/16   [provider]  dicyclomine (BENTYL) 10 MG capsule TAKE 1 CAPSULE BY MOUTH 4 TIMES DAILY AS NEEDED FOR UP TO 14 DAYS FOR SPASMS (STOMACH CRAMPS). 07/05/21   [provider]  diphenhydrAMINE (BENADRYL) 25 MG tablet Take 2 tablets by mouth every 6 hours for 24 hours, then every 8 hours for 24 hours, then as needed. 11/10/21   Shaune Pollack, MD  EPINEPHrine  0.3 mg/0.3 mL IJ SOAJ injection Inject 0.3 mg into the muscle as needed for anaphylaxis. 11/10/21   Shaune Pollack, MD  famotidine (PEPCID) 20 MG tablet Take 1 tablet (20 mg total) by mouth 2 (two) times daily for 3 days. 11/10/21 11/13/21  Shaune Pollack, MD  ibuprofen (ADVIL) 800 MG tablet Take 1 tablet (800 mg total) by mouth every 8 (eight) hours as needed for mild pain or moderate pain. 10/31/21   Tonna Boehringer, Isami, DO  meloxicam (MOBIC) 15 MG tablet Take 15 mg by mouth daily. 08/15/21   [provider]  moxifloxacin (AVELOX) 400 MG tablet Take 1 tablet (400 mg total)  by mouth daily for 7 days. 11/10/21 11/17/21  Shaune Pollack, MD  pantoprazole (PROTONIX) 40 MG tablet Take 1 tablet by mouth daily. 08/08/21   [provider]  sodium chloride flush (NS) 0.9 % SOLN 3 mLs by Intracatheter route every 12 (twelve) hours. 10/31/21 11/30/21  Sung Amabile, DO  traMADol (ULTRAM) 50 MG tablet Take 1 tablet (50 mg total) by mouth every 6 (six) hours as needed. 10/31/21 10/31/22  Tonna Boehringer, Isami, DO  valsartan-hydrochlorothiazide (DIOVAN-HCT) 320-25 MG tablet Take 1 tablet by mouth daily.     [provider]    Allergies Beta adrenergic blockers, Amoxicillin, and Sulfa antibiotics  Family History  Problem Relation Age of Onset   Breast cancer Maternal Aunt     Social History Social History   Tobacco Use   Smoking status: Every Day    Types: Cigarettes   Smokeless tobacco: Never  Vaping Use   Vaping Use: Never used  Substance Use Topics   Alcohol use: Yes    Comment: social   Drug use: No    Review of Systems   Review of Systems  Respiratory:  Negative for shortness of breath.   Gastrointestinal:  Negative for abdominal pain, nausea and vomiting.  Skin:  Positive for rash.  All other systems reviewed and are negative.  Physical Exam Updated Vital Signs BP 108/76 (BP Location: Right Arm)    Pulse (!) 103    Temp 98.8 F (37.1 C) (Oral)    Resp 18    Ht 5\' 1"  (1.549 m)    Wt 80 kg    SpO2 96%    BMI 33.32 kg/m   Physical Exam Vitals and nursing note reviewed.  Constitutional:      General: She is not in acute distress.    Appearance: Normal appearance.  HENT:     Head: Normocephalic and atraumatic.  Eyes:     General: No scleral icterus.    Conjunctiva/sclera: Conjunctivae normal.  Pulmonary:     Effort: Pulmonary effort is normal. No respiratory distress.     Breath sounds: No stridor. No wheezing.  Musculoskeletal:        General: No deformity or signs of injury.     Cervical back: Normal range of motion.  Skin:    General:  Skin is dry.     Coloration: Skin is not jaundiced or pale.     Comments: Diffuse urticaria on the upper extremities thorax and abdomen  Neurological:     General: No focal deficit present.     Mental Status: She is alert and oriented to person, place, and time. Mental status is at baseline.  Psychiatric:        Mood and Affect: Mood normal.        Behavior: Behavior normal.     LABS (all labs ordered are listed, but  only abnormal results are displayed)  Labs Reviewed - No data to display ____________________________________________  EKG   ____________________________________________  RADIOLOGY I, Randol Kern, personally viewed and evaluated these images (plain radiographs) as part of my medical decision making, as well as reviewing the written report by the radiologist.  ED MD interpretation:      ____________________________________________   PROCEDURES  Procedure(s) performed (including Critical Care):  Procedures   ____________________________________________   INITIAL IMPRESSION / ASSESSMENT AND PLAN / ED COURSE     55 year old female presents with urticaria.  Was seen 2 days ago for similar although at that time she was having some respiratory symptoms and was ultimately treated for anaphylaxis.  She has been taking Benadryl and Pepcid at home in addition to moxifloxacin which was prescribed for a early community-acquired pneumonia.  Patient notes that her symptoms have really not improved since being in the ED.  She has no respiratory symptoms or oral lesions.  The itch is very bothersome to her.  She does have diffuse urticaria.  She has no wheezing or oral lesions or oropharyngeal swelling.  Will prescribe high-dose second-generation H1 and continue the H2 blocker.  Will refer to dermatology.  Unclear what the trigger is at this time but she has no other systemic symptoms to suggest anaphylaxis or more systemic drug reaction.       ____________________________________________   FINAL CLINICAL IMPRESSION(S) / ED DIAGNOSES  Final diagnoses:  Urticaria     ED Discharge Orders          Ordered    cetirizine (ZYRTEC ALLERGY) 10 MG tablet  2 times daily        11/12/21 1114    famotidine (PEPCID) 40 MG tablet  Daily        11/12/21 1114             Note:  This document was prepared using Dragon voice recognition software and may include unintentional dictation errors.    Georga Hacking, MD 11/12/21 1146

## 2021-11-15 LAB — CULTURE, BLOOD (ROUTINE X 2): Culture: NO GROWTH

## 2021-11-21 ENCOUNTER — Other Ambulatory Visit: Payer: Self-pay

## 2021-11-22 ENCOUNTER — Other Ambulatory Visit: Payer: Self-pay

## 2021-11-22 ENCOUNTER — Other Ambulatory Visit (HOSPITAL_COMMUNITY): Payer: Self-pay

## 2022-02-02 ENCOUNTER — Emergency Department
Admission: EM | Admit: 2022-02-02 | Discharge: 2022-02-02 | Disposition: A | Discharge status: Home / Self Care | Payer: BC Managed Care – PPO | Attending: Emergency Medicine | Admitting: Emergency Medicine

## 2022-02-02 ENCOUNTER — Emergency Department: Payer: BC Managed Care – PPO

## 2022-02-02 ENCOUNTER — Other Ambulatory Visit: Payer: Self-pay

## 2022-02-02 ENCOUNTER — Encounter: Payer: Self-pay | Admitting: Emergency Medicine

## 2022-02-02 DIAGNOSIS — K5792 Diverticulitis of intestine, part unspecified, without perforation or abscess without bleeding: Secondary | ICD-10-CM | POA: Diagnosis not present

## 2022-02-02 DIAGNOSIS — K5720 Diverticulitis of large intestine with perforation and abscess without bleeding: Secondary | ICD-10-CM

## 2022-02-02 DIAGNOSIS — D72829 Elevated white blood cell count, unspecified: Secondary | ICD-10-CM | POA: Diagnosis not present

## 2022-02-02 DIAGNOSIS — I10 Essential (primary) hypertension: Secondary | ICD-10-CM | POA: Diagnosis not present

## 2022-02-02 DIAGNOSIS — K5780 Diverticulitis of intestine, part unspecified, with perforation and abscess without bleeding: Secondary | ICD-10-CM | POA: Diagnosis not present

## 2022-02-02 DIAGNOSIS — Z79899 Other long term (current) drug therapy: Secondary | ICD-10-CM | POA: Insufficient documentation

## 2022-02-02 DIAGNOSIS — R1032 Left lower quadrant pain: Secondary | ICD-10-CM | POA: Diagnosis present

## 2022-02-02 DIAGNOSIS — Z8616 Personal history of COVID-19: Secondary | ICD-10-CM | POA: Insufficient documentation

## 2022-02-02 LAB — CBC WITH DIFFERENTIAL/PLATELET
Abs Immature Granulocytes: 0.04 10*3/uL (ref 0.00–0.07)
Basophils Absolute: 0.1 10*3/uL (ref 0.0–0.1)
Basophils Relative: 1 %
Eosinophils Absolute: 0.1 10*3/uL (ref 0.0–0.5)
Eosinophils Relative: 1 %
HCT: 33.7 % — ABNORMAL LOW (ref 36.0–46.0)
Hemoglobin: 10.1 g/dL — ABNORMAL LOW (ref 12.0–15.0)
Immature Granulocytes: 0 %
Lymphocytes Relative: 14 %
Lymphs Abs: 1.6 10*3/uL (ref 0.7–4.0)
MCH: 19.9 pg — ABNORMAL LOW (ref 26.0–34.0)
MCHC: 30 g/dL (ref 30.0–36.0)
MCV: 66.5 fL — ABNORMAL LOW (ref 80.0–100.0)
Monocytes Absolute: 0.9 10*3/uL (ref 0.1–1.0)
Monocytes Relative: 8 %
Neutro Abs: 8.3 10*3/uL — ABNORMAL HIGH (ref 1.7–7.7)
Neutrophils Relative %: 76 %
Platelets: 358 10*3/uL (ref 150–400)
RBC: 5.07 MIL/uL (ref 3.87–5.11)
RDW: 18.8 % — ABNORMAL HIGH (ref 11.5–15.5)
WBC: 11 10*3/uL — ABNORMAL HIGH (ref 4.0–10.5)
nRBC: 0 % (ref 0.0–0.2)

## 2022-02-02 LAB — URINALYSIS, ROUTINE W REFLEX MICROSCOPIC
Bacteria, UA: NONE SEEN
Bilirubin Urine: NEGATIVE
Glucose, UA: NEGATIVE mg/dL
Ketones, ur: NEGATIVE mg/dL
Nitrite: NEGATIVE
Protein, ur: NEGATIVE mg/dL
Specific Gravity, Urine: 1.02 (ref 1.005–1.030)
pH: 6 (ref 5.0–8.0)

## 2022-02-02 LAB — COMPREHENSIVE METABOLIC PANEL
ALT: 11 U/L (ref 0–44)
AST: 16 U/L (ref 15–41)
Albumin: 3.6 g/dL (ref 3.5–5.0)
Alkaline Phosphatase: 70 U/L (ref 38–126)
Anion gap: 6 (ref 5–15)
BUN: 13 mg/dL (ref 6–20)
CO2: 27 mmol/L (ref 22–32)
Calcium: 8.8 mg/dL — ABNORMAL LOW (ref 8.9–10.3)
Chloride: 104 mmol/L (ref 98–111)
Creatinine, Ser: 0.69 mg/dL (ref 0.44–1.00)
GFR, Estimated: >60 mL/min (ref >60)
Glucose, Bld: 100 mg/dL — ABNORMAL HIGH (ref 70–99)
Potassium: 3.7 mmol/L (ref 3.5–5.1)
Sodium: 137 mmol/L (ref 135–145)
Total Bilirubin: 0.5 mg/dL (ref 0.3–1.2)
Total Protein: 8.1 g/dL (ref 6.5–8.1)

## 2022-02-02 LAB — LIPASE, BLOOD: Lipase: 32 U/L (ref 11–51)

## 2022-02-02 MED ORDER — ONDANSETRON HCL 4 MG/2ML IJ SOLN
4.0000 mg | Freq: Once | INTRAMUSCULAR | Status: AC
Start: 2022-02-02 — End: 2022-02-02
  Administered 2022-02-02: 4 mg via INTRAVENOUS
  Filled 2022-02-02: qty 2

## 2022-02-02 MED ORDER — IOHEXOL 300 MG/ML  SOLN
100.0000 mL | Freq: Once | INTRAMUSCULAR | Status: AC | PRN
  Administered 2022-02-02: 100 mL via INTRAVENOUS

## 2022-02-02 MED ORDER — CIPROFLOXACIN HCL 500 MG PO TABS: 500.0000 mg | ORAL_TABLET | Freq: Two times a day (BID) | ORAL | 0 refills | Status: DC

## 2022-02-02 MED ORDER — SODIUM CHLORIDE 0.9 % IV BOLUS
1000.0000 mL | Freq: Once | INTRAVENOUS | Status: AC
Start: 2022-02-02 — End: 2022-02-02
  Administered 2022-02-02: 1000 mL via INTRAVENOUS

## 2022-02-02 MED ORDER — ONDANSETRON 4 MG PO TBDP: 4.0000 mg | ORAL_TABLET | Freq: Three times a day (TID) | ORAL | 0 refills | Status: DC | PRN

## 2022-02-02 MED ORDER — METRONIDAZOLE 500 MG PO TABS: 500.0000 mg | ORAL_TABLET | Freq: Two times a day (BID) | ORAL | 0 refills | Status: DC

## 2022-02-02 MED ORDER — HYDROCODONE-ACETAMINOPHEN 5-325 MG PO TABS: 1.0000 | ORAL_TABLET | Freq: Four times a day (QID) | ORAL | 0 refills | Status: DC | PRN

## 2022-02-02 MED ORDER — MORPHINE SULFATE (PF) 4 MG/ML IV SOLN
4.0000 mg | Freq: Once | INTRAVENOUS | Status: AC
  Administered 2022-02-02: 4 mg via INTRAVENOUS
  Filled 2022-02-02: qty 1

## 2022-02-02 MED ORDER — METRONIDAZOLE 500 MG/100ML IV SOLN
500.0000 mg | Freq: Once | INTRAVENOUS | Status: AC
  Administered 2022-02-02: 500 mg via INTRAVENOUS
  Filled 2022-02-02: qty 100

## 2022-02-02 MED ORDER — CIPROFLOXACIN IN D5W 400 MG/200ML IV SOLN
400.0000 mg | Freq: Once | INTRAVENOUS | Status: AC
  Administered 2022-02-02: 400 mg via INTRAVENOUS
  Filled 2022-02-02: qty 200

## 2022-02-02 NOTE — ED Provider Notes (Signed)
[facility] Provider Note    Event Date/Time   First MD Initiated Contact with Patient 02/02/22 0216     (approximate)   History   Abdominal Pain   HPI  Adrienne Adkins is a 56 y.o. female who presents to the ED from reports, worse over the past 3 days associated with dysuria.  History of perforated diverticulitis in December 2022 with percutaneous drain.  Admits to eating large amounts of rice with ground beef prior to onset of pain.  Denies associated fever, chills, cough, chest pain, shortness of breath, nausea, vomiting, diarrhea.  States she takes a laxative every Friday.     Past Medical History   Past Medical History:  Diagnosis Date   Carpal tunnel syndrome    COVID-19 06/2019   Dermatitis    Diverticular disease    GERD (gastroesophageal reflux disease)    History of kidney stones    Hypertension    IGT (impaired glucose tolerance)    Lumbago    Migraines    Palpitations    Vertigo      Active Problem List   Patient Active Problem List   Diagnosis Date Noted   Diverticulitis large intestine 09/20/2021   Obesity (BMI 30-39.9) 08/02/2021   Acute blood loss anemia 07/04/2021   Abdominal pain 07/04/2021   Intra-abdominal abscess (HCC) 07/04/2021   Diverticulitis 07/04/2021   Acute prerenal azotemia 07/04/2021   GERD (gastroesophageal reflux disease)    Hypertension    Acute lower GI bleeding 07/03/2021   Gluteal abscess 02/02/2021   IGT (impaired glucose tolerance) 11/27/2011     Past Surgical History   Past Surgical History:  Procedure Laterality Date   ABDOMINAL HYSTERECTOMY     APPENDECTOMY     COLONOSCOPY     INCISION AND DRAINAGE ABSCESS Left 02/02/2021   Procedure: INCISION AND DRAINAGE ABSCESS;  Surgeon: Hosea Hanawalt Amabile, DO;  Location: [facility] ORS;  Service: General;  Laterality: Left;  IODOFORM PACKING    INDUCED ABORTION     IR SINUS/FIST TUBE CHK-NON GI  11/10/2021   OOPHORECTOMY       Home Medications   Prior  to Admission medications   Medication Sig Start Date End Date Taking? Authorizing Provider  ciprofloxacin (CIPRO) 500 MG tablet Take 1 tablet (500 mg total) by mouth 2 (two) times daily. 02/02/22  Yes Irean Hong, MD  HYDROcodone-acetaminophen (NORCO) 5-325 MG tablet Take 1 tablet by mouth every 6 (six) hours as needed for moderate pain. 02/02/22  Yes Irean Hong, MD  metroNIDAZOLE (FLAGYL) 500 MG tablet Take 1 tablet (500 mg total) by mouth 2 (two) times daily. 02/02/22  Yes Irean Hong, MD  ondansetron (ZOFRAN-ODT) 4 MG disintegrating tablet Take 1 tablet (4 mg total) by mouth every 8 (eight) hours as needed for nausea or vomiting. 02/02/22  Yes Irean Hong, MD  amLODipine (NORVASC) 2.5 MG tablet Take 2.5 mg by mouth daily. 10/11/16   [provider]  cetirizine (ZYRTEC ALLERGY) 10 MG tablet Take 1 tablet (10 mg total) by mouth 2 (two) times daily. 11/12/21 12/12/21  Georga Hacking, MD  dicyclomine (BENTYL) 10 MG capsule TAKE 1 CAPSULE BY MOUTH 4 TIMES DAILY AS NEEDED FOR UP TO 14 DAYS FOR SPASMS (STOMACH CRAMPS). 07/05/21   [provider]  diphenhydrAMINE (BENADRYL) 25 MG tablet Take 2 tablets by mouth every 6 hours for 24 hours, then every 8 hours for 24 hours, then as needed. 11/10/21   Shaune Pollack, MD  EPINEPHrine 0.3 mg/0.3 mL IJ SOAJ injection Inject 0.3 mg into the muscle as needed for anaphylaxis. 11/10/21   Shaune Pollack, MD  famotidine (PEPCID) 20 MG tablet Take 1 tablet (20 mg total) by mouth 2 (two) times daily for 3 days. 11/10/21 11/13/21  Shaune Pollack, MD  famotidine (PEPCID) 40 MG tablet Take 1 tablet (40 mg total) by mouth daily. 11/12/21 12/12/21  Georga Hacking, MD  hydrocortisone 1 % lotion Apply 1 application topically 2 (two) times daily. 11/12/21   Georga Hacking, MD  ibuprofen (ADVIL) 800 MG tablet Take 1 tablet (800 mg total) by mouth every 8 (eight) hours as needed for mild pain or moderate pain. 10/31/21   Tonna Boehringer, Isami, DO  meloxicam  (MOBIC) 15 MG tablet Take 15 mg by mouth daily. 08/15/21   [provider]  pantoprazole (PROTONIX) 40 MG tablet Take 1 tablet by mouth daily. 08/08/21   [provider]  traMADol (ULTRAM) 50 MG tablet Take 1 tablet (50 mg total) by mouth every 6 (six) hours as needed. 10/31/21 10/31/22  Tonna Boehringer, Isami, DO  valsartan-hydrochlorothiazide (DIOVAN-HCT) 320-25 MG tablet Take 1 tablet by mouth daily.     [provider]     Allergies  Beta adrenergic blockers, Amoxicillin, and Sulfa antibiotics   Family History   Family History  Problem Relation Age of Onset   Breast cancer Maternal Aunt      Physical Exam  Triage Vital Signs: ED Triage Vitals  Enc Vitals Group     BP 02/02/22 0122 130/85     Pulse Rate 02/02/22 0122 93     Resp 02/02/22 0122 14     Temp 02/02/22 0122 98.5 F (36.9 C)     Temp Source 02/02/22 0122 Oral     SpO2 02/02/22 0122 97 %     Weight 02/02/22 0109 180 lb (81.6 kg)     Height 02/02/22 0109 5' (1.524 m)     Head Circumference --      Peak Flow --      Pain Score 02/02/22 0109 10     Pain Loc --      Pain Edu? --      Excl. in GC? --     Updated Vital Signs: BP 108/81    Pulse 79    Temp 98.5 F (36.9 C) (Oral)    Resp 16    Ht 5' (1.524 m)    Wt 81.6 kg    SpO2 96%    BMI 35.15 kg/m    General: Awake, no distress.  CV:  RRR good peripheral perfusion.  Resp:  Normal effort.  CTA B. Abd:  Mildly tender to left lower quadrant without rebound or guarding.  No distention.  Other:  No CVAT.   ED Results / Procedures / Treatments  Labs (all labs ordered are listed, but only abnormal results are displayed) Labs Reviewed  CBC WITH DIFFERENTIAL/PLATELET - Abnormal; Notable for the following components:      Result Value   WBC 11.0 (*)    Hemoglobin 10.1 (*)    HCT 33.7 (*)    MCV 66.5 (*)    MCH 19.9 (*)    RDW 18.8 (*)    Neutro Abs 8.3 (*)    All other components within normal limits  COMPREHENSIVE METABOLIC PANEL -  Abnormal; Notable for the following components:   Glucose, Bld 100 (*)    Calcium 8.8 (*)    All other components within normal limits  URINALYSIS, ROUTINE W REFLEX MICROSCOPIC - Abnormal; Notable for the following components:   Color, Urine YELLOW (*)    APPearance CLEAR (*)    Hgb urine dipstick SMALL (*)    Leukocytes,Ua TRACE (*)    All other components within normal limits  LIPASE, BLOOD     EKG  None   RADIOLOGY I have independently visualized and reviewed patient's CT abdomen pelvis as well as noted the radiology interpretation:  CT abdomen pelvis: Recurrent abscess adjacent to left bladder amenable to percutaneous drainage  Official radiology report(s): CT Abdomen Pelvis W Contrast  Result Date: 02/02/2022 CLINICAL DATA:  Left lower quadrant pain for 1 week, history of diverticulitis with drainage initial encounter EXAM: CT ABDOMEN AND PELVIS WITH CONTRAST TECHNIQUE: Multidetector CT imaging of the abdomen and pelvis was performed using the standard protocol following bolus administration of intravenous contrast. RADIATION DOSE REDUCTION: This exam was performed according to the departmental dose-optimization program which includes automated exposure control, adjustment of the mA and/or kV according to patient size and/or use of iterative reconstruction technique. CONTRAST:  100mL OMNIPAQUE IOHEXOL 300 MG/ML  SOLN COMPARISON:  11/10/2021 FINDINGS: Lower chest: No acute abnormality. Hepatobiliary: No focal liver abnormality is seen. No gallstones, gallbladder wall thickening, or biliary dilatation. Pancreas: Unremarkable. No pancreatic ductal dilatation or surrounding inflammatory changes. Spleen: Normal in size without focal abnormality. Adrenals/Urinary Tract: Adrenal glands are within normal limits. Kidneys demonstrate a normal enhancement pattern bilaterally. No renal calculi or obstructive changes are noted. Bladder is again well distended. Thickening of the left lateral and  superior wall is noted with adjacent abscess similar to that seen on prior exam from 10/27/2021. It currently measures 5.8 x 2.8 cm in greatest AP and transverse dimensions respectively. Stomach/Bowel: Diverticular change of the sigmoid colon is again identified which contributes to the abscess along the left aspect of the bladder. More proximal colon is within normal limits. The appendix has been surgically removed. Small bowel and stomach are within normal limits. Vascular/Lymphatic: Aortic atherosclerosis. No enlarged abdominal or pelvic lymph nodes. Reproductive: Status post hysterectomy. No adnexal masses. Other: No abdominal wall hernia or abnormality. No abdominopelvic ascites. Musculoskeletal: No acute or significant osseous findings. IMPRESSION: Changes consistent with recurrent abscess adjacent to the left lateral and superior aspect of the urinary bladder. This would again be amenable to percutaneous drainage. No definitive fistulization into the bladder is noted at this time. Adjacent changes of diverticulitis are identified in the sigmoid colon. No other focal abnormality is noted. Electronically Signed   By: Alcide CleverMark  Lukens M.D.   On: 02/02/2022 03:28     PROCEDURES:  Critical Care performed: No  .1-3 Lead EKG Interpretation Performed by: Irean HongSung, Kobey Sides J, MD Authorized by: Irean HongSung, Gene Glazebrook J, MD     Interpretation: normal     ECG rate:  85   ECG rate assessment: normal     Rhythm: sinus rhythm     Ectopy: none     Conduction: normal   Comments:     Patient placed on cardiac monitor to evaluate for arrhythmias   MEDICATIONS ORDERED IN ED: Medications  metroNIDAZOLE (FLAGYL) IVPB 500 mg (500 mg Intravenous New Bag/Given 02/02/22 0610)  sodium chloride 0.9 % bolus 1,000 mL (0 mLs Intravenous Stopped 02/02/22 0611)  ondansetron (ZOFRAN) injection 4 mg (4 mg Intravenous Given 02/02/22 0254)  morphine (PF) 4 MG/ML injection 4 mg (4 mg Intravenous Given 02/02/22 0255)  iohexol (OMNIPAQUE) 300 MG/ML  solution 100 mL (100 mLs Intravenous Contrast Given 02/02/22 0308)  ciprofloxacin (CIPRO) IVPB 400 mg (0 mg Intravenous Stopped 02/02/22 0604)     IMPRESSION / MDM / ASSESSMENT AND PLAN / ED COURSE  I reviewed the triage vital signs and the nursing notes.                             56 year old female presenting with left lower quadrant abdominal pain. Differential diagnosis includes, but is not limited to, ovarian cyst, ovarian torsion, acute appendicitis, diverticulitis, urinary tract infection/pyelonephritis, endometriosis, bowel obstruction, colitis, renal colic, gastroenteritis, hernia, fibroids, endometriosis, etc. I have personally reviewed patient's records and note her hospitalization for perforated diverticulitis in December 2022.  The patient is on the cardiac monitor to evaluate for evidence of arrhythmia and/or significant heart rate changes.  Laboratory results demonstrate mild leukocytosis, WBC 11, normal metabolic panel.  Will initiate IV fluid resuscitation, pain, IV Morphine for pain paired with IV Zofran for nausea.  Given patient's history of perforated diverticulitis requiring percutaneous drain in December 2022, will obtain CT abdomen pelvis.  Will reassess.  Clinical Course as of 02/02/22 0649  Fri Feb 02, 2022  0981 Updated patient on UA with trace leukocytes and CT scan results remarkable for recurrent abscess and diverticulitis.  Patient is very adamant that she does not want to be hospitalized, does not want another percutaneous drain, and does not want surgery.  I have discussed this with Dr. Maia Plan who is on-call for general surgery who agrees with antibiotics and follow-up in the office.  Have discussed with patient that she will be leaving AGAINST MEDICAL ADVICE as her infection may worsen despite antibiotic use which would put her at risk for sepsis, permanent disability and death.  Patient knows she is welcome to return at anytime, especially if she experiences  worsening pain, persistent vomiting or fever.  Patient understands this and is agreeable with plan of care. [JS]  [id number] Patient is finishing up IV Flagyl and will be discharged home with prescriptions for Cipro, Flagyl and as needed Percocet and Zofran.  Very strict return precautions given.  Patient verbalizes understanding and agrees with plan of care. [JS]    Clinical Course User Index [JS] Irean Hong, MD     FINAL CLINICAL IMPRESSION(S) / ED DIAGNOSES   Final diagnoses:  Left lower quadrant abdominal pain  Diverticulitis  Colonic diverticular abscess     Rx / DC Orders   ED Discharge Orders          Ordered    ciprofloxacin (CIPRO) 500 MG tablet  2 times daily        02/02/22 0437    metroNIDAZOLE (FLAGYL) 500 MG tablet  2 times daily        02/02/22 0437    HYDROcodone-acetaminophen (NORCO) 5-325 MG tablet  Every 6 hours PRN        02/02/22 0437    ondansetron (ZOFRAN-ODT) 4 MG disintegrating tablet  Every 8 hours PRN        02/02/22 0437             Note:  This document was prepared using Dragon voice recognition software and may include unintentional dictation errors.   Irean Hong, MD 02/02/22 [phone]

## 2022-02-02 NOTE — ED Notes (Signed)
Pt has elected to leave AMA, discharge paperwork and prescriptions reviewed as well as work note.  All questions answered.  IV removed.  ?

## 2022-02-02 NOTE — ED Notes (Signed)
Pt updated on need for urine sample. Pt denies any need to go at this time. Will let me know when they are able to do so.  °

## 2022-02-02 NOTE — ED Triage Notes (Signed)
Patient ambulatory to triage with steady gait, without difficulty or distress noted; pt reports left lower abd pain x wk accomp by dysuria; st hx of diverticulitis ?

## 2022-02-02 NOTE — Discharge Instructions (Signed)
Your CT scan shows recurrent abscess near your bladder due to diverticulitis.  We have recommended admitting you to the hospital with percutaneous drain.  You have decided to go home and try oral antibiotics.  Please know that you are welcome to return at any time, especially if you feel worse, experience fever, persistent vomiting, worsening pain, etc.  Please take antibiotics as prescribed (Cipro/Flagyl 500 mg twice daily x7 days).  You may take pain and nausea medicine as needed (Norco/Zofran #20).  Please follow-up with Dr. Lysle Pearl as soon as possible. ?

## 2022-02-19 ENCOUNTER — Ambulatory Visit: Payer: Self-pay | Admitting: Surgery

## 2022-02-19 MED ORDER — INDOCYANINE GREEN 25 MG IV SOLR
5.0000 mg | Freq: Once | INTRAVENOUS | Status: AC
  Filled 2022-02-19: qty 10

## 2022-02-19 NOTE — H&P (Addendum)
Subjective: ? ?CC: Diverticulitis of large intestine without perforation or abscess without bleeding [K57.32] ? ?HPI: ? Adrienne Adkins is a 56 y.o. female who returns for evaluation of above. LLQ has never completely resolved despite all previous interventions.  Nothing debilitating but patient is now ready to proceed with elective sigmoidectomy to be pain free.  ?  ?Past Medical History:  has a past medical history of Anxiety, Bronchitis (02/2013), Carpal tunnel syndrome, Constipation, Dermatitis, Diverticular disease, Diverticulitis (10/2017), GERD (gastroesophageal reflux disease), Hypertension, IGT (impaired glucose tolerance) (2013), Lumbago, Migraines, Obesity (BMI 30-39.9), unspecified, Palpitations, and Vertigo. ? ?Past Surgical History:  has a past surgical history that includes Colonoscopy (12/2012); Induced Abortion By Dilation & Curettage; Appendectomy (1998); Oophorectomy; and Hysterectomy. ? ?Family History: family history includes Asthma in her father; Breast cancer in her maternal aunt; Coronary Artery Disease (Blocked arteries around heart) in her mother; Diabetes type II in her mother; Diverticulitis in her mother; High blood pressure (Hypertension) in her mother; Lung cancer in her father; Myocardial Infarction (Heart attack) in her mother; No Known Problems in her sister and sister; Ovarian cancer in her cousin; Stroke in her mother. ? ?Social History:  reports that she has been smoking cigarettes. She has been smoking an average of .25 packs per day. She has never used smokeless tobacco. She reports current alcohol use. She reports that she does not use drugs. ? ?Current Medications: has a current medication list which includes the following prescription(s): amlodipine, pantoprazole, valsartan-hydrochlorothiazide, dicyclomine, docusate, hydrocortisone, meloxicam, naproxen, prednisone, prednisone, and tramadol. ? ?Allergies:  ?Allergies ?Allergen Reactions ? Amoxicillin (Bulk) Hives and  Rash ?  ALL PENICILLIN FAMILY - INCLUDING "COUSINS" ? Cipro [Ciprofloxacin Hcl] Hives ? Flagyl [Metronidazole] Hives ? Beta-Blockers (Beta-Adrenergic Blocking Agts) Other (See Comments) ?  Prolonged P waves on EKG ? Sulfa (Sulfonamide Antibiotics) Rash ? ? ?ROS:  ?A 15 point review of systems was performed and pertinent positives and negatives noted in HPI ?  ?Objective: ?  ? ?BP 105/72   Pulse 91   Ht 154.9 cm (5\' 1" )   Wt 84.4 kg (186 lb)   BMI 35.14 kg/m?  ? ?Constitutional :  No distress, cooperative, alert ?Lymphatics/Throat:  Supple with no lymphadenopathy ?Respiratory:  Clear to auscultation bilaterally ?Cardiovascular:  Regular rate and rhythm ?Gastrointestinal: Soft, non-tender, non-distended, no organomegaly. ?Musculoskeletal: Steady gait and movement ?Skin: Cool and moist ?Psychiatric: Normal affect, non-agitated, not confused ?  ?  ?LABS:  ?n/a  ? ?RADS: ?n/a ? ?Assessment: ? ?   ?Diverticulitis of large intestine without perforation or abscess without bleeding [K57.32] ? ?Plan: ? ?  ?Will proceed with elective robotic assited laparoscopic sigmoidectomy in hopes of complete resolution of symptoms. The risk of laparoscopic colon resection surgery includes, but not limited to, recurrence, bleeding, chronic pain, post-op infxn, post-op SBO or ileus, hernias, resection of bowel, re-anastamosis, possible ostomy placement and need for re-operation to address said risks. The risks of general anesthetic, if used, includes MI, CVA, sudden death or even reaction to anesthetic medications also discussed. Alternatives include continued observation.  Benefits include possible symptom relief, preventing further decline in health and possible death. ?  ?Typical post-op recovery time of additional days in hospital for observation afterwards also discussed. ?  ?Prep ordered.  Will proceed with ERAS protocol as well.  Pending medical clearance and workup as noted above.   ? ?Urology with ICG injection preop as well  preop marking by WOC, in case need for ostomy. ?  ?The patient verbalized understanding and  all questions were answered to the patient's satisfaction. ? ?labs/images/medications/previous chart entries reviewed personally and relevant changes/updates noted above. ? ? ?

## 2022-02-19 NOTE — H&P (View-Only) (Signed)
Subjective: ? ?CC: Diverticulitis of large intestine without perforation or abscess without bleeding [K57.32] ? ?HPI: ? Adrienne Adkins is a 56 y.o. female who returns for evaluation of above. LLQ has never completely resolved despite all previous interventions.  Nothing debilitating but patient is now ready to proceed with elective sigmoidectomy to be pain free.  ?  ?Past Medical History:  has a past medical history of Anxiety, Bronchitis (02/2013), Carpal tunnel syndrome, Constipation, Dermatitis, Diverticular disease, Diverticulitis (10/2017), GERD (gastroesophageal reflux disease), Hypertension, IGT (impaired glucose tolerance) (2013), Lumbago, Migraines, Obesity (BMI 30-39.9), unspecified, Palpitations, and Vertigo. ? ?Past Surgical History:  has a past surgical history that includes Colonoscopy (12/2012); Induced Abortion By Dilation & Curettage; Appendectomy (1998); Oophorectomy; and Hysterectomy. ? ?Family History: family history includes Asthma in her father; Breast cancer in her maternal aunt; Coronary Artery Disease (Blocked arteries around heart) in her mother; Diabetes type II in her mother; Diverticulitis in her mother; High blood pressure (Hypertension) in her mother; Lung cancer in her father; Myocardial Infarction (Heart attack) in her mother; No Known Problems in her sister and sister; Ovarian cancer in her cousin; Stroke in her mother. ? ?Social History:  reports that she has been smoking cigarettes. She has been smoking an average of .25 packs per day. She has never used smokeless tobacco. She reports current alcohol use. She reports that she does not use drugs. ? ?Current Medications: has a current medication list which includes the following prescription(s): amlodipine, pantoprazole, valsartan-hydrochlorothiazide, dicyclomine, docusate, hydrocortisone, meloxicam, naproxen, prednisone, prednisone, and tramadol. ? ?Allergies:  ?Allergies ?Allergen Reactions ? Amoxicillin (Bulk) Hives and  Rash ?  ALL PENICILLIN FAMILY - INCLUDING "COUSINS" ? Cipro [Ciprofloxacin Hcl] Hives ? Flagyl [Metronidazole] Hives ? Beta-Blockers (Beta-Adrenergic Blocking Agts) Other (See Comments) ?  Prolonged P waves on EKG ? Sulfa (Sulfonamide Antibiotics) Rash ? ? ?ROS:  ?A 15 point review of systems was performed and pertinent positives and negatives noted in HPI ?  ?Objective: ?  ? ?BP 105/72   Pulse 91   Ht 154.9 cm (5' 1")   Wt 84.4 kg (186 lb)   BMI 35.14 kg/m?  ? ?Constitutional :  No distress, cooperative, alert ?Lymphatics/Throat:  Supple with no lymphadenopathy ?Respiratory:  Clear to auscultation bilaterally ?Cardiovascular:  Regular rate and rhythm ?Gastrointestinal: Soft, non-tender, non-distended, no organomegaly. ?Musculoskeletal: Steady gait and movement ?Skin: Cool and moist ?Psychiatric: Normal affect, non-agitated, not confused ?  ?  ?LABS:  ?n/a  ? ?RADS: ?n/a ? ?Assessment: ? ?   ?Diverticulitis of large intestine without perforation or abscess without bleeding [K57.32] ? ?Plan: ? ?  ?Will proceed with elective robotic assited laparoscopic sigmoidectomy in hopes of complete resolution of symptoms. The risk of laparoscopic colon resection surgery includes, but not limited to, recurrence, bleeding, chronic pain, post-op infxn, post-op SBO or ileus, hernias, resection of bowel, re-anastamosis, possible ostomy placement and need for re-operation to address said risks. The risks of general anesthetic, if used, includes MI, CVA, sudden death or even reaction to anesthetic medications also discussed. Alternatives include continued observation.  Benefits include possible symptom relief, preventing further decline in health and possible death. ?  ?Typical post-op recovery time of additional days in hospital for observation afterwards also discussed. ?  ?Prep ordered.  Will proceed with ERAS protocol as well.  Pending medical clearance and workup as noted above.   ? ?Urology with ICG injection preop as well  preop marking by WOC, in case need for ostomy. ?  ?The patient verbalized understanding and   all questions were answered to the patient's satisfaction. ? ?labs/images/medications/previous chart entries reviewed personally and relevant changes/updates noted above. ? ? ?

## 2022-02-21 ENCOUNTER — Other Ambulatory Visit: Payer: Self-pay

## 2022-02-26 ENCOUNTER — Encounter
Admission: RE | Admit: 2022-02-26 | Discharge: 2022-02-26 | Disposition: A | Discharge status: Home / Self Care | Payer: BC Managed Care – PPO | Source: Ambulatory Visit | Attending: Surgery | Admitting: Surgery

## 2022-02-26 VITALS — Ht 61.0 in | Wt 180.0 lb

## 2022-02-26 DIAGNOSIS — Z01812 Encounter for preprocedural laboratory examination: Secondary | ICD-10-CM

## 2022-02-26 HISTORY — DX: Diverticulitis of large intestine without perforation or abscess without bleeding: K57.32

## 2022-02-26 NOTE — Patient Instructions (Addendum)
Your procedure is scheduled on: Tuesday, April 4 ?Report to the Registration Desk on the 1st floor of the [facility]. ?To find out your arrival time, please call [phone] between 1PM - 3PM on: Monday, April 3 ? ?REMEMBER: ?Instructions that are not followed completely may result in serious medical risk, up to and including death; or upon the discretion of your surgeon and anesthesiologist your surgery may need to be rescheduled. ? ?Do not eat food after midnight the night before surgery.  ?No gum chewing, lozengers or hard candies. ? ?TAKE THESE MEDICATIONS THE MORNING OF SURGERY WITH A SIP OF WATER: ? ?Amlodipine ?Pantoprazole - (take one the night before and one on the morning of surgery - helps to prevent nausea after surgery.) ?Levofloxacin ? ?One week prior to surgery: ?Stop Anti-inflammatories (NSAIDS) such as Advil, Aleve, Ibuprofen, Motrin, Naproxen, Naprosyn and Aspirin based products such as Excedrin, Goodys Powder, BC Powder. ?Stop ANY OVER THE COUNTER supplements until after surgery. ?You may however, continue to take Tylenol if needed for pain up until the day of surgery. ? ?No Alcohol for 24 hours before or after surgery. ? ?No Smoking including e-cigarettes for 24 hours prior to surgery.  ?No chewable tobacco products for at least 6 hours prior to surgery.  ?No nicotine patches on the day of surgery. ? ?Do not use any "recreational" drugs for at least a week prior to your surgery.  ?Please be advised that the combination of cocaine and anesthesia may have negative outcomes, up to and including death. ?If you test positive for cocaine, your surgery will be cancelled. ? ?On the morning of surgery brush your teeth with toothpaste and water, you may rinse your mouth with mouthwash if you wish. ?Do not swallow any toothpaste or mouthwash. ? ?Use CHG Soap as directed on instruction sheet or shower using antibacterial soap prior to coming to the hospital. ? ?Do not wear jewelry, make-up, hairpins,  clips or nail polish. ? ?Do not wear lotions, powders, or perfumes.  ? ?Do not shave body from the neck down 48 hours prior to surgery just in case you cut yourself which could leave a site for infection.  ?Also, freshly shaved skin may become irritated if using the CHG soap. ? ?Contact lenses, hearing aids and dentures may not be worn into surgery. ? ?Do not bring valuables to the hospital. [facility] is not responsible for any missing/lost belongings or valuables.  ? ?Follow diet and bowel prep as directed by your surgeon. ? ?Notify your doctor if there is any change in your medical condition (cold, fever, infection). ? ?Wear comfortable clothing (specific to your surgery type) to the hospital. ? ?After surgery, you can help prevent lung complications by doing breathing exercises.  ?Take deep breaths and cough every 1-2 hours. Your doctor may order a device called an Incentive Spirometer to help you take deep breaths. ?When coughing or sneezing, hold a pillow firmly against your incision with both hands. This is called ?splinting.? Doing this helps protect your incision. It also decreases belly discomfort. ? ?If you are being admitted to the hospital overnight, leave your suitcase in the car. ?After surgery it may be brought to your room. ? ?If you are being discharged the day of surgery, you will not be allowed to drive home. ?You will need a responsible adult (18 years or older) to drive you home and stay with you that night.  ? ?If you are taking public transportation, you will need to have  a responsible adult (18 years or older) with you. ?Please confirm with your physician that it is acceptable to use public transportation.  ? ?Please call the [facility] at [phone] if you have any questions about these instructions. ? ?Surgery Visitation Policy: ? ?Patients undergoing a surgery or procedure may have two family members or support persons with them as long as the person is not COVID-19  positive or experiencing its symptoms.  ? ?Inpatient Visitation:   ? ?Visiting hours are 7 a.m. to 8 p.m. ?Up to four visitors are allowed at one time in a patient room, including children. The visitors may rotate out with other people during the day. One designated support person (adult) may remain overnight.  ?

## 2022-02-27 ENCOUNTER — Inpatient Hospital Stay: Payer: BC Managed Care – PPO | Admitting: Anesthesiology

## 2022-02-27 ENCOUNTER — Other Ambulatory Visit: Payer: Self-pay

## 2022-02-27 ENCOUNTER — Inpatient Hospital Stay
Admission: RE | Admit: 2022-02-27 | Discharge: 2022-03-03 | DRG: 331 | Disposition: A | Discharge status: Home / Self Care | Payer: BC Managed Care – PPO | Attending: Surgery | Admitting: Surgery

## 2022-02-27 ENCOUNTER — Encounter: Payer: Self-pay | Admitting: Surgery

## 2022-02-27 ENCOUNTER — Encounter
Admission: RE | Disposition: A | Discharge status: Home / Self Care | Payer: Self-pay | Source: Home / Self Care | Attending: Surgery

## 2022-02-27 DIAGNOSIS — Z823 Family history of stroke: Secondary | ICD-10-CM | POA: Diagnosis not present

## 2022-02-27 DIAGNOSIS — K5732 Diverticulitis of large intestine without perforation or abscess without bleeding: Principal | ICD-10-CM

## 2022-02-27 DIAGNOSIS — Z833 Family history of diabetes mellitus: Secondary | ICD-10-CM

## 2022-02-27 DIAGNOSIS — Z803 Family history of malignant neoplasm of breast: Secondary | ICD-10-CM

## 2022-02-27 DIAGNOSIS — F17210 Nicotine dependence, cigarettes, uncomplicated: Secondary | ICD-10-CM | POA: Diagnosis present

## 2022-02-27 DIAGNOSIS — Z8249 Family history of ischemic heart disease and other diseases of the circulatory system: Secondary | ICD-10-CM | POA: Diagnosis not present

## 2022-02-27 DIAGNOSIS — E669 Obesity, unspecified: Secondary | ICD-10-CM | POA: Diagnosis present

## 2022-02-27 DIAGNOSIS — Z8041 Family history of malignant neoplasm of ovary: Secondary | ICD-10-CM | POA: Diagnosis not present

## 2022-02-27 DIAGNOSIS — K660 Peritoneal adhesions (postprocedural) (postinfection): Secondary | ICD-10-CM | POA: Diagnosis present

## 2022-02-27 DIAGNOSIS — Z825 Family history of asthma and other chronic lower respiratory diseases: Secondary | ICD-10-CM | POA: Diagnosis not present

## 2022-02-27 DIAGNOSIS — Z880 Allergy status to penicillin: Secondary | ICD-10-CM | POA: Diagnosis not present

## 2022-02-27 DIAGNOSIS — Z882 Allergy status to sulfonamides status: Secondary | ICD-10-CM | POA: Diagnosis not present

## 2022-02-27 DIAGNOSIS — Z888 Allergy status to other drugs, medicaments and biological substances status: Secondary | ICD-10-CM

## 2022-02-27 DIAGNOSIS — K5792 Diverticulitis of intestine, part unspecified, without perforation or abscess without bleeding: Secondary | ICD-10-CM | POA: Diagnosis not present

## 2022-02-27 DIAGNOSIS — Z01812 Encounter for preprocedural laboratory examination: Secondary | ICD-10-CM

## 2022-02-27 DIAGNOSIS — I10 Essential (primary) hypertension: Secondary | ICD-10-CM | POA: Diagnosis present

## 2022-02-27 DIAGNOSIS — K5720 Diverticulitis of large intestine with perforation and abscess without bleeding: Secondary | ICD-10-CM | POA: Diagnosis present

## 2022-02-27 DIAGNOSIS — Z6835 Body mass index (BMI) 35.0-35.9, adult: Secondary | ICD-10-CM | POA: Diagnosis not present

## 2022-02-27 DIAGNOSIS — Z801 Family history of malignant neoplasm of trachea, bronchus and lung: Secondary | ICD-10-CM | POA: Diagnosis not present

## 2022-02-27 DIAGNOSIS — R42 Dizziness and giddiness: Secondary | ICD-10-CM | POA: Diagnosis present

## 2022-02-27 LAB — URINALYSIS, ROUTINE W REFLEX MICROSCOPIC
Bacteria, UA: NONE SEEN
Specific Gravity, Urine: 1 — ABNORMAL LOW (ref 1.005–1.030)
Squamous Epithelial / HPF: NONE SEEN (ref 0–5)

## 2022-02-27 LAB — TYPE AND SCREEN
ABO/RH(D): O POS
Antibody Screen: NEGATIVE

## 2022-02-27 LAB — CBC
HCT: 32.2 % — ABNORMAL LOW (ref 36.0–46.0)
Hemoglobin: 9.9 g/dL — ABNORMAL LOW (ref 12.0–15.0)
MCH: 20 pg — ABNORMAL LOW (ref 26.0–34.0)
MCHC: 30.7 g/dL (ref 30.0–36.0)
MCV: 65.1 fL — ABNORMAL LOW (ref 80.0–100.0)
Platelets: 319 10*3/uL (ref 150–400)
RBC: 4.95 MIL/uL (ref 3.87–5.11)
RDW: 18.3 % — ABNORMAL HIGH (ref 11.5–15.5)
WBC: 11.8 10*3/uL — ABNORMAL HIGH (ref 4.0–10.5)
nRBC: 0 % (ref 0.0–0.2)

## 2022-02-27 LAB — CREATININE, SERUM
Creatinine, Ser: 0.73 mg/dL (ref 0.44–1.00)
GFR, Estimated: >60 mL/min (ref >60)

## 2022-02-27 SURGERY — COLECTOMY, SIGMOID, ROBOT-ASSISTED
Anesthesia: General

## 2022-02-27 MED ORDER — FENTANYL CITRATE (PF) 100 MCG/2ML IJ SOLN
INTRAMUSCULAR | Status: AC
Start: 2022-02-27 — End: ?
  Filled 2022-02-27: qty 2

## 2022-02-27 MED ORDER — BUPIVACAINE HCL (PF) 0.5 % IJ SOLN
INTRAMUSCULAR | Status: AC
  Filled 2022-02-27: qty 30

## 2022-02-27 MED ORDER — HYDROCODONE-ACETAMINOPHEN 5-325 MG PO TABS
1.0000 | ORAL_TABLET | ORAL | Status: DC | PRN
  Administered 2022-02-27 – 2022-03-01 (×8): 2 via ORAL
  Administered 2022-03-01: 1 via ORAL
  Administered 2022-03-02 – 2022-03-03 (×5): 2 via ORAL
  Filled 2022-02-27 (×15): qty 2

## 2022-02-27 MED ORDER — ONDANSETRON HCL 4 MG/2ML IJ SOLN
INTRAMUSCULAR | Status: AC
  Filled 2022-02-27: qty 2

## 2022-02-27 MED ORDER — PHENYLEPHRINE HCL-NACL 20-0.9 MG/250ML-% IV SOLN
INTRAVENOUS | Status: AC
  Filled 2022-02-27: qty 250

## 2022-02-27 MED ORDER — LACTATED RINGERS IV SOLN: INTRAVENOUS | Status: DC

## 2022-02-27 MED ORDER — LACTATED RINGERS IV SOLN
INTRAVENOUS | Status: DC
Start: 2022-02-27 — End: 2022-02-27

## 2022-02-27 MED ORDER — METHYLENE BLUE 0.5 % INJ SOLN
INTRAVENOUS | Status: AC
  Filled 2022-02-27: qty 10

## 2022-02-27 MED ORDER — ONDANSETRON HCL 4 MG/2ML IJ SOLN
INTRAMUSCULAR | Status: DC | PRN
  Administered 2022-02-27 (×2): 4 mg via INTRAVENOUS

## 2022-02-27 MED ORDER — ONDANSETRON HCL 4 MG/2ML IJ SOLN: 4.0000 mg | Freq: Four times a day (QID) | INTRAMUSCULAR | Status: DC | PRN

## 2022-02-27 MED ORDER — ENOXAPARIN SODIUM 40 MG/0.4ML IJ SOSY
40.0000 mg | PREFILLED_SYRINGE | INTRAMUSCULAR | Status: DC
  Administered 2022-02-28 – 2022-03-03 (×4): 40 mg via SUBCUTANEOUS
  Filled 2022-02-27 (×4): qty 0.4

## 2022-02-27 MED ORDER — CHLORHEXIDINE GLUCONATE 0.12 % MT SOLN: 15.0000 mL | Freq: Once | OROMUCOSAL | Status: AC

## 2022-02-27 MED ORDER — DEXAMETHASONE SODIUM PHOSPHATE 10 MG/ML IJ SOLN
INTRAMUSCULAR | Status: DC | PRN
  Administered 2022-02-27: 10 mg via INTRAVENOUS

## 2022-02-27 MED ORDER — DEXMEDETOMIDINE (PRECEDEX) IN NS 20 MCG/5ML (4 MCG/ML) IV SYRINGE
PREFILLED_SYRINGE | INTRAVENOUS | Status: DC | PRN
  Administered 2022-02-27: 8 ug via INTRAVENOUS
  Administered 2022-02-27 (×2): 12 ug via INTRAVENOUS
  Administered 2022-02-27: 8 ug via INTRAVENOUS

## 2022-02-27 MED ORDER — ROCURONIUM BROMIDE 10 MG/ML (PF) SYRINGE
PREFILLED_SYRINGE | INTRAVENOUS | Status: AC
  Filled 2022-02-27: qty 10

## 2022-02-27 MED ORDER — FENTANYL CITRATE (PF) 100 MCG/2ML IJ SOLN
25.0000 ug | INTRAMUSCULAR | Status: DC | PRN
  Administered 2022-02-27 (×2): 50 ug via INTRAVENOUS

## 2022-02-27 MED ORDER — GABAPENTIN 300 MG PO CAPS: 300.0000 mg | ORAL_CAPSULE | ORAL | Status: AC

## 2022-02-27 MED ORDER — LIDOCAINE HCL (PF) 2 % IJ SOLN
INTRAMUSCULAR | Status: AC
  Filled 2022-02-27: qty 5

## 2022-02-27 MED ORDER — AMLODIPINE BESYLATE 5 MG PO TABS
2.5000 mg | ORAL_TABLET | Freq: Every day | ORAL | Status: DC
  Administered 2022-02-28 – 2022-03-03 (×3): 2.5 mg via ORAL
  Filled 2022-02-27 (×3): qty 1

## 2022-02-27 MED ORDER — PROPOFOL 10 MG/ML IV BOLUS
INTRAVENOUS | Status: AC
  Filled 2022-02-27: qty 20

## 2022-02-27 MED ORDER — HYDROCHLOROTHIAZIDE 25 MG PO TABS
25.0000 mg | ORAL_TABLET | Freq: Every day | ORAL | Status: DC
  Administered 2022-02-28 – 2022-03-03 (×3): 25 mg via ORAL
  Filled 2022-02-27 (×4): qty 1

## 2022-02-27 MED ORDER — CHLORHEXIDINE GLUCONATE CLOTH 2 % EX PADS
6.0000 | MEDICATED_PAD | Freq: Once | CUTANEOUS | Status: AC
  Administered 2022-02-27: 6 via TOPICAL

## 2022-02-27 MED ORDER — MIDAZOLAM HCL 2 MG/2ML IJ SOLN
INTRAMUSCULAR | Status: DC | PRN
Start: 2022-02-27 — End: 2022-02-27
  Administered 2022-02-27 (×2): 1 mg via INTRAVENOUS

## 2022-02-27 MED ORDER — ESMOLOL HCL 100 MG/10ML IV SOLN
INTRAVENOUS | Status: DC | PRN
  Administered 2022-02-27: 20 mg via INTRAVENOUS
  Administered 2022-02-27 (×2): 30 mg via INTRAVENOUS

## 2022-02-27 MED ORDER — OXYCODONE HCL 5 MG/5ML PO SOLN: 5.0000 mg | Freq: Once | ORAL | Status: DC | PRN

## 2022-02-27 MED ORDER — DEXAMETHASONE SODIUM PHOSPHATE 10 MG/ML IJ SOLN
INTRAMUSCULAR | Status: AC
  Filled 2022-02-27: qty 1

## 2022-02-27 MED ORDER — SODIUM CHLORIDE 0.9 % IR SOLN
Status: DC | PRN
  Administered 2022-02-27 (×2): 1000 mL

## 2022-02-27 MED ORDER — PANTOPRAZOLE SODIUM 40 MG PO TBEC
40.0000 mg | DELAYED_RELEASE_TABLET | Freq: Every day | ORAL | Status: DC
  Administered 2022-02-28 – 2022-03-03 (×4): 40 mg via ORAL
  Filled 2022-02-27 (×4): qty 1

## 2022-02-27 MED ORDER — FENTANYL CITRATE (PF) 100 MCG/2ML IJ SOLN
INTRAMUSCULAR | Status: DC | PRN
Start: 2022-02-27 — End: 2022-02-27
  Administered 2022-02-27 (×4): 50 ug via INTRAVENOUS

## 2022-02-27 MED ORDER — OXYCODONE HCL 5 MG PO TABS: 5.0000 mg | ORAL_TABLET | Freq: Once | ORAL | Status: DC | PRN

## 2022-02-27 MED ORDER — ALBUMIN HUMAN 5 % IV SOLN
INTRAVENOUS | Status: AC
  Filled 2022-02-27: qty 250

## 2022-02-27 MED ORDER — SODIUM CHLORIDE (PF) 0.9 % IJ SOLN
INTRAMUSCULAR | Status: DC | PRN
  Administered 2022-02-27: 100 mL

## 2022-02-27 MED ORDER — HYDROMORPHONE HCL 1 MG/ML IJ SOLN
INTRAMUSCULAR | Status: AC
  Filled 2022-02-27: qty 1

## 2022-02-27 MED ORDER — MIDAZOLAM HCL 2 MG/2ML IJ SOLN
INTRAMUSCULAR | Status: AC
  Filled 2022-02-27: qty 2

## 2022-02-27 MED ORDER — INDOCYANINE GREEN 25 MG IV SOLR
INTRAVENOUS | Status: DC | PRN
  Administered 2022-02-27: 5 mg via INTRAVENOUS

## 2022-02-27 MED ORDER — VISTASEAL 10 ML SINGLE DOSE KIT
PACK | CUTANEOUS | Status: DC | PRN
  Administered 2022-02-27: 10 mL via TOPICAL

## 2022-02-27 MED ORDER — GABAPENTIN 300 MG PO CAPS
ORAL_CAPSULE | ORAL | Status: AC
  Administered 2022-02-27: 300 mg via ORAL
  Filled 2022-02-27: qty 1

## 2022-02-27 MED ORDER — SODIUM CHLORIDE 0.9 % IV SOLN
1.0000 g | INTRAVENOUS | Status: AC
  Administered 2022-02-27: 1 g via INTRAVENOUS
  Filled 2022-02-27: qty 1

## 2022-02-27 MED ORDER — ACETAMINOPHEN 500 MG PO TABS: 1000.0000 mg | ORAL_TABLET | ORAL | Status: AC

## 2022-02-27 MED ORDER — LACTATED RINGERS IV SOLN: INTRAVENOUS | Status: DC | PRN

## 2022-02-27 MED ORDER — ACETAMINOPHEN 500 MG PO TABS
ORAL_TABLET | ORAL | Status: AC
  Administered 2022-02-27: 1000 mg via ORAL
  Filled 2022-02-27: qty 2

## 2022-02-27 MED ORDER — LIDOCAINE HCL (CARDIAC) PF 100 MG/5ML IV SOSY
PREFILLED_SYRINGE | INTRAVENOUS | Status: DC | PRN
Start: 2022-02-27 — End: 2022-02-27
  Administered 2022-02-27: 60 mg via INTRAVENOUS

## 2022-02-27 MED ORDER — TRAMADOL HCL 50 MG PO TABS
50.0000 mg | ORAL_TABLET | Freq: Four times a day (QID) | ORAL | Status: DC | PRN
  Administered 2022-03-01: 50 mg via ORAL
  Filled 2022-02-27 (×2): qty 1

## 2022-02-27 MED ORDER — HYDROMORPHONE HCL 1 MG/ML IJ SOLN
0.5000 mg | INTRAMUSCULAR | Status: DC | PRN
  Administered 2022-02-27 – 2022-02-28 (×5): 0.5 mg via INTRAVENOUS
  Filled 2022-02-27 (×5): qty 0.5

## 2022-02-27 MED ORDER — ONDANSETRON 4 MG PO TBDP: 4.0000 mg | ORAL_TABLET | Freq: Four times a day (QID) | ORAL | Status: DC | PRN

## 2022-02-27 MED ORDER — SUGAMMADEX SODIUM 200 MG/2ML IV SOLN
INTRAVENOUS | Status: DC | PRN
Start: 2022-02-27 — End: 2022-02-27
  Administered 2022-02-27: 200 mg via INTRAVENOUS

## 2022-02-27 MED ORDER — KETAMINE HCL 50 MG/5ML IJ SOSY
PREFILLED_SYRINGE | INTRAMUSCULAR | Status: AC
  Filled 2022-02-27: qty 5

## 2022-02-27 MED ORDER — ROCURONIUM BROMIDE 100 MG/10ML IV SOLN
INTRAVENOUS | Status: DC | PRN
  Administered 2022-02-27 (×5): 20 mg via INTRAVENOUS
  Administered 2022-02-27: 10 mg via INTRAVENOUS
  Administered 2022-02-27: 20 mg via INTRAVENOUS
  Administered 2022-02-27: 10 mg via INTRAVENOUS
  Administered 2022-02-27: 50 mg via INTRAVENOUS

## 2022-02-27 MED ORDER — IRBESARTAN 150 MG PO TABS
300.0000 mg | ORAL_TABLET | Freq: Every day | ORAL | Status: DC
  Administered 2022-02-28 – 2022-03-03 (×3): 300 mg via ORAL
  Filled 2022-02-27 (×3): qty 2

## 2022-02-27 MED ORDER — CHLORHEXIDINE GLUCONATE 0.12 % MT SOLN
OROMUCOSAL | Status: AC
  Administered 2022-02-27: 15 mL via OROMUCOSAL
  Filled 2022-02-27: qty 15

## 2022-02-27 MED ORDER — PROPOFOL 10 MG/ML IV BOLUS
INTRAVENOUS | Status: DC | PRN
  Administered 2022-02-27: 150 mg via INTRAVENOUS

## 2022-02-27 MED ORDER — 0.9 % SODIUM CHLORIDE (POUR BTL) OPTIME
TOPICAL | Status: DC | PRN
  Administered 2022-02-27: 150 mL

## 2022-02-27 MED ORDER — VISTASEAL 10 ML SINGLE DOSE KIT
PACK | CUTANEOUS | Status: AC
  Filled 2022-02-27: qty 10

## 2022-02-27 MED ORDER — GABAPENTIN 300 MG PO CAPS
300.0000 mg | ORAL_CAPSULE | Freq: Two times a day (BID) | ORAL | Status: DC
  Administered 2022-02-27 – 2022-03-03 (×8): 300 mg via ORAL
  Filled 2022-02-27 (×8): qty 1

## 2022-02-27 MED ORDER — ACETAMINOPHEN 500 MG PO TABS
1000.0000 mg | ORAL_TABLET | Freq: Four times a day (QID) | ORAL | Status: DC | PRN
  Administered 2022-03-01: 1000 mg via ORAL
  Filled 2022-02-27 (×2): qty 2

## 2022-02-27 MED ORDER — ACETAMINOPHEN 10 MG/ML IV SOLN: 1000.0000 mg | Freq: Once | INTRAVENOUS | Status: DC | PRN

## 2022-02-27 MED ORDER — KETAMINE HCL 10 MG/ML IJ SOLN
INTRAMUSCULAR | Status: DC | PRN
  Administered 2022-02-27: 30 mg via INTRAVENOUS
  Administered 2022-02-27: 20 mg via INTRAVENOUS

## 2022-02-27 MED ORDER — VALSARTAN-HYDROCHLOROTHIAZIDE 320-25 MG PO TABS: 1.0000 | ORAL_TABLET | Freq: Every day | ORAL | Status: DC

## 2022-02-27 MED ORDER — FENTANYL CITRATE (PF) 100 MCG/2ML IJ SOLN
INTRAMUSCULAR | Status: AC
  Filled 2022-02-27: qty 2

## 2022-02-27 MED ORDER — ONDANSETRON HCL 4 MG/2ML IJ SOLN: 4.0000 mg | Freq: Once | INTRAMUSCULAR | Status: DC | PRN

## 2022-02-27 MED ORDER — SODIUM CHLORIDE (PF) 0.9 % IJ SOLN
INTRAMUSCULAR | Status: AC
  Filled 2022-02-27: qty 50

## 2022-02-27 MED ORDER — PHENYLEPHRINE HCL (PRESSORS) 10 MG/ML IV SOLN
INTRAVENOUS | Status: DC | PRN
Start: 2022-02-27 — End: 2022-02-27
  Administered 2022-02-27 (×2): 80 ug via INTRAVENOUS

## 2022-02-27 MED ORDER — ORAL CARE MOUTH RINSE: 15.0000 mL | Freq: Once | OROMUCOSAL | Status: AC

## 2022-02-27 MED ORDER — BUPIVACAINE LIPOSOME 1.3 % IJ SUSP
INTRAMUSCULAR | Status: AC
  Filled 2022-02-27: qty 20

## 2022-02-27 MED ORDER — HYDROMORPHONE HCL 1 MG/ML IJ SOLN
INTRAMUSCULAR | Status: DC | PRN
  Administered 2022-02-27 (×3): .5 mg via INTRAVENOUS

## 2022-02-27 SURGICAL SUPPLY — 87 items
APPLICATOR VISTASEAL 35 (MISCELLANEOUS) ×1 IMPLANT
BAG INFUSER PRESSURE 100CC (MISCELLANEOUS) ×1 IMPLANT
BAG URINE DRAIN 2000ML AR STRL (UROLOGICAL SUPPLIES) ×1 IMPLANT
BLADE CLIPPER SURG (BLADE) ×2 IMPLANT
BLADE SURG SZ11 CARB STEEL (BLADE) ×2 IMPLANT
BULB RESERV EVAC DRAIN JP 100C (MISCELLANEOUS) ×1 IMPLANT
CANNULA REDUC XI 12-8 STAPL (CANNULA) ×1
CANNULA REDUCER 12-8 DVNC XI (CANNULA) ×1 IMPLANT
CATH FOL LX CONE TIP  8F (CATHETERS) ×1
CATH FOL LX CONE TIP 8F (CATHETERS) IMPLANT
CATH FOLEY SIL 2WAY 14FR5CC (CATHETERS) ×1 IMPLANT
CATH ROBINSON RED A/P 14FR (CATHETERS) ×1 IMPLANT
CNTNR SPEC 2.5X3XGRAD LEK (MISCELLANEOUS) ×2
CONT SPEC 4OZ STER OR WHT (MISCELLANEOUS) ×2
CONTAINER SPEC 2.5X3XGRAD LEK (MISCELLANEOUS) IMPLANT
COVER TIP SHEARS 8 DVNC (MISCELLANEOUS) ×1 IMPLANT
COVER TIP SHEARS 8MM DA VINCI (MISCELLANEOUS) ×1
DRAIN CHANNEL JP 15F RND 16 (MISCELLANEOUS) ×1 IMPLANT
DRAPE ARM DVNC X/XI (DISPOSABLE) ×4 IMPLANT
DRAPE COLUMN DVNC XI (DISPOSABLE) ×1 IMPLANT
DRAPE DA VINCI XI ARM (DISPOSABLE) ×4
DRAPE DA VINCI XI COLUMN (DISPOSABLE) ×1
DRAPE LEGGINS SURG 28X43 STRL (DRAPES) ×2 IMPLANT
DRAPE UNDER BUTTOCK W/FLU (DRAPES) ×2 IMPLANT
DRSG OPSITE POSTOP 4X6 (GAUZE/BANDAGES/DRESSINGS) ×4 IMPLANT
ELECT CAUTERY BLADE 6.4 (BLADE) ×1 IMPLANT
ELECT REM PT RETURN 9FT ADLT (ELECTROSURGICAL) ×2
ELECTRODE REM PT RTRN 9FT ADLT (ELECTROSURGICAL) ×1 IMPLANT
GAUZE SPONGE 4X4 12PLY STRL (GAUZE/BANDAGES/DRESSINGS) ×1 IMPLANT
GLOVE SURG SYN 6.5 ES PF (GLOVE) ×6 IMPLANT
GLOVE SURG SYN 6.5 PF PI (GLOVE) ×3 IMPLANT
GLOVE SURG UNDER POLY LF SZ7 (GLOVE) ×6 IMPLANT
GOWN STRL REUS W/ TWL LRG LVL3 (GOWN DISPOSABLE) ×6 IMPLANT
GOWN STRL REUS W/TWL LRG LVL3 (GOWN DISPOSABLE) ×6
HANDLE YANKAUER SUCT BULB TIP (MISCELLANEOUS) ×2 IMPLANT
IRRIGATION STRYKERFLOW (MISCELLANEOUS) IMPLANT
IRRIGATOR STRYKERFLOW (MISCELLANEOUS) ×2
IRRIGATOR SUCT 8 DISP DVNC XI (IRRIGATION / IRRIGATOR) IMPLANT
IRRIGATOR SUCTION 8MM XI DISP (IRRIGATION / IRRIGATOR) ×1
IV NS 1000ML (IV SOLUTION) ×3
IV NS 1000ML BAXH (IV SOLUTION) IMPLANT
KIT IMAGING PINPOINTPAQ (MISCELLANEOUS) ×3 IMPLANT
KIT PINK PAD W/HEAD ARE REST (MISCELLANEOUS) ×2
KIT PINK PAD W/HEAD ARM REST (MISCELLANEOUS) ×1 IMPLANT
MANIFOLD NEPTUNE II (INSTRUMENTS) ×2 IMPLANT
NEEDLE HYPO 22GX1.5 SAFETY (NEEDLE) ×2 IMPLANT
OBTURATOR OPTICAL STANDARD 8MM (TROCAR) ×1
OBTURATOR OPTICAL STND 8 DVNC (TROCAR) ×1
OBTURATOR OPTICALSTD 8 DVNC (TROCAR) ×1 IMPLANT
PACK COLON CLEAN CLOSURE (MISCELLANEOUS) ×2 IMPLANT
PACK CYSTO (CUSTOM PROCEDURE TRAY) ×1 IMPLANT
PACK LAP CHOLECYSTECTOMY (MISCELLANEOUS) ×2 IMPLANT
PENCIL ELECTRO HAND CTR (MISCELLANEOUS) ×2 IMPLANT
PORT ACCESS TROCAR AIRSEAL 5 (TROCAR) ×1 IMPLANT
RELOAD STAPLE 60 3.5 BLU DVNC (STAPLE) IMPLANT
RELOAD STAPLER 3.5X60 BLU DVNC (STAPLE) ×3 IMPLANT
SEAL CANN UNIV 5-8 DVNC XI (MISCELLANEOUS) ×3 IMPLANT
SEAL XI 5MM-8MM UNIVERSAL (MISCELLANEOUS) ×3
SEALER VESSEL DA VINCI XI (MISCELLANEOUS) ×1
SEALER VESSEL EXT DVNC XI (MISCELLANEOUS) IMPLANT
SOL PREP PVP 2OZ (MISCELLANEOUS) ×4
SOLUTION ELECTROLUBE (MISCELLANEOUS) ×2 IMPLANT
SOLUTION PREP PVP 2OZ (MISCELLANEOUS) ×1 IMPLANT
SPONGE T-LAP 18X18 ~~LOC~~+RFID (SPONGE) ×2 IMPLANT
STAPLER 60 DA VINCI SURE FORM (STAPLE) ×1
STAPLER 60 SUREFORM DVNC (STAPLE) IMPLANT
STAPLER CANNULA SEAL DVNC XI (STAPLE) ×1 IMPLANT
STAPLER CANNULA SEAL XI (STAPLE) ×1
STAPLER CIRCULAR MANUAL XL 29 (STAPLE) ×1 IMPLANT
STAPLER RELOAD 3.5X60 BLU DVNC (STAPLE) ×3
STAPLER RELOAD 3.5X60 BLUE (STAPLE) ×3
STAPLER RELOADABLE 65 2-0 SUT (MISCELLANEOUS) IMPLANT
STAPLER SKIN PROX 35W (STAPLE) ×1 IMPLANT
STAPLER SYS INTERNAL RELOAD SS (MISCELLANEOUS) ×2 IMPLANT
SURGILUBE 2OZ TUBE FLIPTOP (MISCELLANEOUS) ×2 IMPLANT
SUT ETHILON 3-0 (SUTURE) ×1 IMPLANT
SUT MNCRL AB 4-0 PS2 18 (SUTURE) ×4 IMPLANT
SUT PDS AB 1 CT1 36 (SUTURE) ×2 IMPLANT
SUT VIC AB 3-0 SH 27 (SUTURE) ×2
SUT VIC AB 3-0 SH 27X BRD (SUTURE) ×1 IMPLANT
SYR 30ML LL (SYRINGE) ×4 IMPLANT
SYR TOOMEY IRRIG 70ML (MISCELLANEOUS) ×2
SYRINGE TOOMEY IRRIG 70ML (MISCELLANEOUS) IMPLANT
SYS TROCAR 1.5-3 SLV ABD GEL (ENDOMECHANICALS) ×2
SYSTEM TROCR 1.5-3 SLV ABD GEL (ENDOMECHANICALS) ×1 IMPLANT
TUBING CONNECTING 10 (TUBING) ×1 IMPLANT
WATER STERILE IRR 500ML POUR (IV SOLUTION) ×2 IMPLANT

## 2022-02-27 NOTE — Op Note (Signed)
Preoperative diagnosis:  ?Recurrent diverticulitis ? ?Postoperative diagnosis:  ?Surgeon ? ?Procedure: ?Cystoscopy  ?Bilateral ureteral catheterization with injection indocyanine green ? ?Surgeon: Riki Altes, MD ? ?Anesthesia: General ? ?Complications: None ? ?Intraoperative findings:  ?Cystoscopy-UOs normal-appearing with clear efflux.  Bladder mucosa without solid or papillary lesions.  On the upper left posterior wall was a broad area of bullous edema.  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 ? ?EBL: Minimal ? ?Specimens: None ? ?Indication: Inari Shin is a 56 y.o. female scheduled for robotic sigmoid colectomy by Dr. Tonna Boehringer.  Injection of intraureteral ICG has been requested to aid in intraoperative ureteral localization.  Informed consent has been obtained. ? ?Description of procedure: ? ?The patient was taken to the operating room and general anesthesia was induced.  The patient was placed in the dorsal lithotomy position, prepped and draped in the usual sterile fashion, and preoperative antibiotics were administered. A preoperative time-out was performed.  ? ?A 21 French cystoscope was lubricated and passed per urethra.  Panendoscopy was performed with findings as described above. ? ?A 7 French cone-tip catheter was placed through the cystoscope and into the left UO.  10 mL of ICG was instilled. ? ?Attention was directed to the contralateral UO where an identical procedure was performed. ? ?A 16 French Foley catheter was placed with 10 mL of sterile water placed in the balloon. ? ?She was reprepped and draped for her robotic sigmoid colectomy which will be dictated separately by Dr. Tonna Boehringer. ? ? ?Riki Altes, M.D. ? ?

## 2022-02-27 NOTE — Progress Notes (Signed)
Notified family that patient is going to room 201 on the second floor, and is doing well ?

## 2022-02-27 NOTE — Anesthesia Preprocedure Evaluation (Addendum)
Anesthesia Evaluation  ?Patient identified by MRN, date of birth, ID band ?Patient awake ? ? ? ?Reviewed: ?Allergy & Precautions, NPO status , Patient's Chart, lab work & pertinent test results ? ?History of Anesthesia Complications ?Negative for: history of anesthetic complications ? ?Airway ?Mallampati: III ? ? ?Neck ROM: Full ? ? ? Dental ?no notable dental hx. ? ?  ?Pulmonary ?Current Smoker (3 cigarettes per day) and Patient abstained from smoking.,  ?  ?Pulmonary exam normal ?breath sounds clear to auscultation ? ? ? ? ? ? Cardiovascular ?hypertension, Normal cardiovascular exam ?Rhythm:Regular Rate:Normal ? ? ?  ?Neuro/Psych ? Headaches, Vertigo  ?  ? GI/Hepatic ?GERD  ,Diverticulitis  ?  ?Endo/Other  ?Obesity  ? Renal/GU ?negative Renal ROS  ? ?  ?Musculoskeletal ? ? Abdominal ?  ?Peds ? Hematology ? ?(+) Blood dyscrasia, anemia ,   ?Anesthesia Other Findings ? ? Reproductive/Obstetrics ? ?  ? ? ? ? ? ? ? ? ? ? ? ? ? ?  ?  ? ? ? ? ? ? ? ?Anesthesia Physical ?Anesthesia Plan ? ?ASA: 3 ? ?Anesthesia Plan: General  ? ?Post-op Pain Management:   ? ?Induction: Intravenous ? ?PONV Risk Score and Plan: 2 and Ondansetron, Dexamethasone and Treatment may vary due to age or medical condition ? ?Airway Management Planned: Oral ETT ? ?Additional Equipment:  ? ?Intra-op Plan:  ? ?Post-operative Plan: Extubation in OR ? ?Informed Consent: I have reviewed the patients History and Physical, chart, labs and discussed the procedure including the risks, benefits and alternatives for the proposed anesthesia with the patient or authorized representative who has indicated his/her understanding and acceptance.  ? ? ? ?Dental advisory given ? ?Plan Discussed with: CRNA ? ?Anesthesia Plan Comments: (Patient consented for risks of anesthesia including but not limited to:  ?- adverse reactions to medications ?- damage to eyes, teeth, lips or other oral mucosa ?- nerve damage due to positioning  ?-  sore throat or hoarseness ?- damage to heart, brain, nerves, lungs, other parts of body or loss of life ? ?Informed patient about role of CRNA in peri- and intra-operative care.  Patient voiced understanding.)  ? ? ? ? ? ? ?Anesthesia Quick Evaluation ? ?

## 2022-02-27 NOTE — Transfer of Care (Signed)
Immediate Anesthesia Transfer of Care Note ? ?Patient: Adrienne Adkins ? ?Procedure(s) Performed: XI ROBOT ASSISTED SIGMOID COLECTOMY ?CYSTOSCOPY WITH ICG ? ?Patient Location: PACU ? ?Anesthesia Type:General ? ?Level of Consciousness: awake ? ?Airway & Oxygen Therapy: Patient Spontanous Breathing ? ?Post-op Assessment: Report given to RN and Post -op Vital signs reviewed and stable ? ?Post vital signs: Reviewed and stable ? ?Last Vitals:  ?Vitals Value Taken Time  ?BP 124/89 02/27/22 1618  ?Temp 36.4 ?C 02/27/22 1618  ?Pulse 95 02/27/22 1629  ?Resp 14 02/27/22 1629  ?SpO2 95 % 02/27/22 1629  ?Vitals shown include unvalidated device data. ? ?Last Pain:  ?Vitals:  ? 02/27/22 0743  ?TempSrc: Temporal  ?PainSc: 0-No pain  ?   ? ?Patients Stated Pain Goal: 0 (02/27/22 0743) ? ?Complications: No notable events documented. ?

## 2022-02-27 NOTE — Anesthesia Procedure Notes (Signed)
Procedure Name: Intubation ?Date/Time: 02/27/2022 8:13 AM ?Performed by: Loletha Grayer, CRNA ?Pre-anesthesia Checklist: Patient identified, Patient being monitored, Timeout performed, Emergency Drugs available and Suction available ?Patient Re-evaluated:Patient Re-evaluated prior to induction ?Oxygen Delivery Method: Circle system utilized ?Preoxygenation: Pre-oxygenation with 100% oxygen ?Induction Type: IV induction ?Ventilation: Mask ventilation without difficulty and Oral airway inserted - appropriate to patient size ?Laryngoscope Size: Mac and 3 ?Grade View: Grade I ?Tube type: Oral ?Tube size: 7.0 mm ?Number of attempts: 1 ?Airway Equipment and Method: Stylet ?Placement Confirmation: ETT inserted through vocal cords under direct vision, positive ETCO2 and breath sounds checked- equal and bilateral ?Secured at: 22 cm ?Tube secured with: Tape ?Dental Injury: Teeth and Oropharynx as per pre-operative assessment  ?Comments: Pt intubated by Criss Alvine, under supervision of CRNA, MD  ? ? ? ? ?

## 2022-02-27 NOTE — Progress Notes (Signed)
Adrienne Adkins is scheduled for robotic assisted sigmoid colectomy this morning by Dr. Lysle Pearl. Injection of intraureteral ICG has been requested to aid in intraoperative ureteral localization.  The procedure was discussed including potential risks of bleeding and infection.  All questions were answered. ?

## 2022-02-27 NOTE — Interval H&P Note (Signed)
History and Physical Interval Note: ? ?02/27/2022 ?8:04 AM ? ?Adrienne Adkins  has presented today for surgery, with the diagnosis of Partial Colon Removal.  The various methods of treatment have been discussed with the patient and family. After consideration of risks, benefits and other options for treatment, the patient has consented to  Procedure(s): ?XI ROBOT [place] (N/A) ?CYSTOSCOPY WITH ICG (N/A) as a surgical intervention.  The patient's history has been reviewed, patient examined, no change in status, stable for surgery.  I have reviewed the patient's chart and labs.  Questions were answered to the patient's satisfaction.   ? ? ?Adrienne Adkins Lysle Pearl ? ? ?

## 2022-02-28 LAB — CBC
HCT: 31.2 % — ABNORMAL LOW (ref 36.0–46.0)
Hemoglobin: 9.5 g/dL — ABNORMAL LOW (ref 12.0–15.0)
MCH: 20.1 pg — ABNORMAL LOW (ref 26.0–34.0)
MCHC: 30.4 g/dL (ref 30.0–36.0)
MCV: 66.1 fL — ABNORMAL LOW (ref 80.0–100.0)
Platelets: 328 10*3/uL (ref 150–400)
RBC: 4.72 MIL/uL (ref 3.87–5.11)
RDW: 18.3 % — ABNORMAL HIGH (ref 11.5–15.5)
WBC: 9.5 10*3/uL (ref 4.0–10.5)
nRBC: 0 % (ref 0.0–0.2)

## 2022-02-28 LAB — BASIC METABOLIC PANEL
Anion gap: 7 (ref 5–15)
BUN: 16 mg/dL (ref 6–20)
CO2: 27 mmol/L (ref 22–32)
Calcium: 8.4 mg/dL — ABNORMAL LOW (ref 8.9–10.3)
Chloride: 104 mmol/L (ref 98–111)
Creatinine, Ser: 0.67 mg/dL (ref 0.44–1.00)
GFR, Estimated: >60 mL/min (ref >60)
Glucose, Bld: 95 mg/dL (ref 70–99)
Potassium: 4.2 mmol/L (ref 3.5–5.1)
Sodium: 138 mmol/L (ref 135–145)

## 2022-02-28 MED ORDER — SODIUM CHLORIDE 0.9 % IV SOLN: INTRAVENOUS | Status: DC

## 2022-02-28 MED ORDER — SODIUM CHLORIDE 0.9 % IV SOLN
1.0000 g | INTRAVENOUS | Status: DC
  Administered 2022-02-28 – 2022-03-02 (×3): 1000 mg via INTRAVENOUS
  Filled 2022-02-28 (×4): qty 1

## 2022-02-28 MED ORDER — CHLORHEXIDINE GLUCONATE CLOTH 2 % EX PADS: 6.0000 | MEDICATED_PAD | Freq: Every day | CUTANEOUS | Status: DC

## 2022-02-28 MED ORDER — SODIUM CHLORIDE 0.9 % IV SOLN: INTRAVENOUS | Status: AC

## 2022-02-28 MED ORDER — BOOST / RESOURCE BREEZE PO LIQD CUSTOM
1.0000 | Freq: Three times a day (TID) | ORAL | Status: DC
  Administered 2022-02-28 – 2022-03-02 (×4): 1 via ORAL

## 2022-02-28 NOTE — TOC Initial Note (Signed)
Transition of Care (TOC) - Initial/Assessment Note  ? ? ?Patient Details  ?Name: Adrienne Adkins ?MRN: [id number] ?Date of Birth: [birth year] ? ?Transition of Care (TOC) CM/SW Contact:    ?Chapman Fitch, RN ?Phone Number: ?02/28/2022, 9:15 AM ? ?Clinical Narrative:                 ? ? ?Transition of Care (TOC) Screening Note ? ? ?Patient Details  ?Name: Adrienne Adkins ?Date of Birth: 08-30-1966 ? ? ?Transition of Care (TOC) CM/SW Contact:    ?Chapman Fitch, RN ?Phone Number: ?02/28/2022, 9:15 AM ? ? ? ?Transition of Care Department [facility]) has reviewed patient and no TOC needs have been identified at this time. We will continue to monitor patient advancement through interdisciplinary progression rounds. If new patient transition needs arise, please place a TOC consult. ? ? ?  ?  ? ? ?Patient Goals and CMS Choice ?  ?  ?  ? ?Expected Discharge Plan and Services ?  ?  ?  ?  ?  ?                ?  ?  ?  ?  ?  ?  ?  ?  ?  ?  ? ?Prior Living Arrangements/Services ?  ?  ?  ?       ?  ?  ?  ?  ? ?Activities of Daily Living ?Home Assistive Devices/Equipment: None ?ADL Screening (condition at time of admission) ?Patient's cognitive ability adequate to safely complete daily activities?: Yes ?Is the patient deaf or have difficulty hearing?: No ?Does the patient have difficulty seeing, even when wearing glasses/contacts?: No ?Does the patient have difficulty concentrating, remembering, or making decisions?: No ?Patient able to express need for assistance with ADLs?: Yes ?Does the patient have difficulty dressing or bathing?: No ?Independently performs ADLs?: Yes (appropriate for developmental age) ?Does the patient have difficulty walking or climbing stairs?: No ?Weakness of Legs: None ?Weakness of Arms/Hands: None ? ?Permission Sought/Granted ?  ?  ?   ?   ?   ?   ? ?Emotional Assessment ?  ?  ?  ?  ?  ?  ? ?Admission diagnosis:  Diverticulitis large intestine [K57.32] ?Patient Active Problem List  ? Diagnosis Date Noted  ?  Diverticulitis large intestine 09/20/2021  ? Obesity (BMI 30-39.9) 08/02/2021  ? Acute blood loss anemia 07/04/2021  ? Abdominal pain 07/04/2021  ? Intra-abdominal abscess (HCC) 07/04/2021  ? Diverticulitis 07/04/2021  ? Acute prerenal azotemia 07/04/2021  ? GERD (gastroesophageal reflux disease)   ? Hypertension   ? Acute lower GI bleeding 07/03/2021  ? Gluteal abscess 02/02/2021  ? IGT (impaired glucose tolerance) 11/27/2011  ? ?PCP:  Gracelyn Nurse, MD ?Pharmacy:   ?[id number] [place], [place] - [address] ?[address] ?[place] [address] ?Phone: [phone] Fax: [phone] ? ? ? ? ?Social Determinants of Health (SDOH) Interventions ?  ? ?Readmission Risk Interventions ?   ? View : No data to display.  ?  ?  ?  ? ? ? ?

## 2022-02-28 NOTE — Progress Notes (Signed)
Subjective:  ?CC: ?Adrienne Adkins is a 56 y.o. female  Hospital stay day 1, 1 Day Post-Op robotic assisted laparoscopic left hemicolectomy for chronic diverticulitis with abscess ? ?HPI: ?Sore on LLQ, no flatus of BM.  No nausea ? ?ROS:  ?General: Denies weight loss, weight gain, fatigue, fevers, chills, and night sweats. ?Heart: Denies chest pain, palpitations, racing heart, irregular heartbeat, leg pain or swelling, and decreased activity tolerance. ?Respiratory: Denies breathing difficulty, shortness of breath, wheezing, cough, and sputum. ?GI: Denies change in appetite, heartburn, nausea, vomiting, constipation, diarrhea, and blood in stool. ?GU: Denies difficulty urinating, pain with urinating, urgency, frequency, blood in urine. ? ? ?Objective:  ? ?Temp:  [97.4 ?F (36.3 ?C)-98.5 ?F (36.9 ?C)] 97.4 ?F (36.3 ?C) (04/05 0715) ?Pulse Rate:  [73-98] 79 (04/05 0715) ?Resp:  [10-20] 18 (04/05 0715) ?BP: (93-124)/(66-89) 104/76 (04/05 0715) ?SpO2:  [94 %-100 %] 96 % (04/05 0715)     Height: 5\' 1"  (154.9 cm) Weight: 81.6 kg BMI (Calculated): 34.01  ? ?Intake/Output this shift:  ? ?Intake/Output Summary (Last 24 hours) at 02/28/2022 1342 ?Last data filed at 02/28/2022 1200 ?Gross per 24 hour  ?Intake 2060 ml  ?Output 1135 ml  ?Net 925 ml  ? ? ?Constitutional :  alert, cooperative, appears stated age, and no distress  ?Respiratory:  clear to auscultation bilaterally  ?Cardiovascular:  regular rate and rhythm  ?Gastrointestinal: Soft, no guarding, some TTP in epigastric around incision site, LLQ TTP as well . Drain with old bloody discharge  ?Skin: Cool and moist. Staples C/D/I.  ?Psychiatric: Normal affect, non-agitated, not confused  ?   ?  ?LABS:  ? ?  Latest Ref Rng & Units 02/28/2022  ?  6:55 AM 02/27/2022  ?  7:16 PM 02/02/2022  ?  1:27 AM  ?CMP  ?Glucose 70 - 99 mg/dL 95    04/04/2022    ?BUN 6 - 20 mg/dL 16    13    ?Creatinine 0.44 - 1.00 mg/dL 545   6.25   6.38    ?Sodium 135 - 145 mmol/L 138    137    ?Potassium 3.5 - 5.1  mmol/L 4.2    3.7    ?Chloride 98 - 111 mmol/L 104    104    ?CO2 22 - 32 mmol/L 27    27    ?Calcium 8.9 - 10.3 mg/dL 8.4    8.8    ?Total Protein 6.5 - 8.1 g/dL   8.1    ?Total Bilirubin 0.3 - 1.2 mg/dL   0.5    ?Alkaline Phos 38 - 126 U/L   70    ?AST 15 - 41 U/L   16    ?ALT 0 - 44 U/L   11    ? ? ?  Latest Ref Rng & Units 02/28/2022  ?  6:55 AM 02/27/2022  ?  7:16 PM 02/02/2022  ?  1:27 AM  ?CBC  ?WBC 4.0 - 10.5 K/uL 9.5   11.8   11.0    ?Hemoglobin 12.0 - 15.0 g/dL 9.5   9.9   04/04/2022    ?Hematocrit 36.0 - 46.0 % 31.2   32.2   33.7    ?Platelets 150 - 400 K/uL 328   319   358    ? ? ?RADS: ?N/a ?Assessment:  ? ?S/p  robotic assisted laparoscopic left hemicolectomy for chronic diverticulitis with abscess. ? ?Some generalized pain with no flatus or BM, otherwise taking in only small sips of clears.  Will continue clears for now.  Old bloody discharge in JP, hemoglobin with minimal change, continue to monitor. ? ?labs/images/medications/previous chart entries reviewed personally and relevant changes/updates noted above. ? ? ?

## 2022-02-28 NOTE — Progress Notes (Signed)
Mobility Specialist - Progress Note ? ? ? 02/28/22 1300  ?Mobility  ?Activity Ambulated with assistance in hallway;Stood at bedside;Dangled on edge of bed  ?Level of Assistance Standby assist, set-up cues, supervision of patient - no hands on  ?Assistive Device Front wheel walker  ?Distance Ambulated (ft) 160 ft  ?Activity Response Tolerated well  ?$Mobility charge 1 Mobility  ? ? ?Pt supine upon arrival using RA with NT and RN at bedside. Pt completes pericare indep to doon gown. Completes bed mobility ModI and STS with SBA. Pt ambulates 135ft with supervision -- 3 pt initiated breaks d/t voiced "light headedness", motivated to continue. Pt returns to bed with needs in reach and RN notified of session. ? ?Clarisa Schools ?Mobility Specialist ?02/28/22, 1:31 PM ?  ? ? ? ?

## 2022-02-28 NOTE — Op Note (Addendum)
Preoperative diagnosis: diverticulitis with abscess ?Postoperative diagnosis: Same ? ?Procedure: Robotic assisted laparoscopic sigmoidectomy, lysis of adhesions, splenic flexure takedown ?  ?Anesthesia: GETA ?  ?Surgeon: Benjamine Sprague ?Assistant: Cintron for exposure and bedside assist  ?  ?Wound Classification: clean contaminated ?  ?Specimen: Sigmoid colon ?  ?Complications: None ?  ?Estimated Blood Loss: 75 mL ? ?Indications: Please see H&P for further details.   ?  ?FIndings: ?1.  persistent diverticular abscess at sigmoid, but no obvious colovesicular fistula ?2.  Successful EEA anastomosis with no air leak  ?3.  Normal anatomy, with preservation of bilateral ureter confirmed with ICG ?4.  Adequate hemostasis.  ? ?  ?Description of procedure: The patient was placed on the operating table in the low lithotomy position, both arms tucked. General anesthesia was induced.  Foley placed. A time-out was completed verifying correct patient, procedure, site, positioning, and implant(s) and/or special equipment prior to beginning this procedure. The abdomen was prepped and draped in the usual sterile fashion.  ?  ?RLQ incision made and dissection carried down to fascia and abdomen entered.  Gelport placed after 3mm trocar inserted through port, and insufflation started to 53mm Hg without issue. 4 ports were then placed along the right abdominal wall, three 8 mm ports and 2mm airseal assist port.  Exparel infused as a tap block.   ?  ?Omental attachments and portion of falciform detached using vessel sealer from abdominal wall to accommodate instrument access to area of concern. Inspection of the sigmoid colon, noted thickened sigmoid colon wall and lateral attachments consistent with chronic diverticulitis, with small bowel adhesions aroundt it.  The majority of the procedure was then dedicated to gently separating the small bowel from the sigmoid colon, then dissecting the distal sigmoid off the lateral wall and  peritoneal attachments to isolate the area of concern.  The thickened portion was then gently dissected off the lateral wall, at which time an abscess cavity with frank purulent drainage was noted. Infection present in the abdomen due to abscess. This was drained completely and dissection eventually completed to separate the colon from the abscess and adjacent bladder.  Left and right ureter identified via ICG and noted to be away from dissection area to avoid injury.  There was no leakage of ICG or fluid from the bladder wall side, and no gross injury noted after completing dissection. ? ?Lateral attachments continued to be separated up and around the splenic flexure.  The left meso colon was then divided using vessel sealer from distal transversae all the way to rectum, dividing the IMV along the way.  The thickened and fatty mesentary required additional time for safe dissection and transection. ? ?ICG then infused by anesthesia and proximal colon with good perfusion chosen for anvil insertion site. 39mm blue load stapler used the transect left colon at this point and and at rectum, moved off view of operative field. Proximal colon then noted to reach rectal stump under minimal tension.  Robot undocked at this time to complete procedure laparoscopically. ? ?The proximal colon was pulled through the RLQ gelport and 67mm EEA stapler anvil secured into end after using pursestring device to transect the staple line.  Colon then placed back into abdomen.  Left colon specimen then pulled out through gelport, passed off operative field pending pathology. Abdomen reinsufflated and proximal colon placed over rectal stump with lap graspers. ? ?EEA stapler placed through the rectal stump and guided to the distal staple line and a end-to-side anastomosis was created  laparoscopically, after confirming no twisting of the proximal mesentery and no tension noted on the staple line.  Saline was infused into the pelvis, and a air  leak test was performed and anastomosis did not show any leaks.  All saline was then suctioned out, anastomosis looked viable, and hemostasis was noted throughout the abdomen.  Vistaseal infused around the staple line for additional security.  [place] drain then pushed through the RLQ 50mm port and placed over the staple line, secured to the skin using 3-0 nylon.  Abdomen then desufflated and gelport removed. ?  ?Clean closure protocol initiated.  Lower quadrant gelport site closed with 1 PDS.  3-0 Vicryl used to approximate the subcutaneous tissue prior to closing all skin sites with staples, then dressed with honeycomb dressing.  JP drain site dressed with drain sponge and paper tape.  The patient tolerated the procedure well, awakened from anesthesia and was taken to the postanesthesia care unit in satisfactory condition with Foley in place.  Sponge count correct at end of procedure. ? ?

## 2022-02-28 NOTE — Anesthesia Postprocedure Evaluation (Signed)
Anesthesia Post Note ? ?Patient: Adrienne Adkins ? ?Procedure(s) Performed: XI ROBOT ASSISTED SIGMOID COLECTOMY ?CYSTOSCOPY WITH ICG ? ?Patient location during evaluation: PACU ?Anesthesia Type: General ?Level of consciousness: awake and alert, oriented and patient cooperative ?Pain management: pain level controlled ?Vital Signs Assessment: post-procedure vital signs reviewed and stable ?Respiratory status: spontaneous breathing, nonlabored ventilation and respiratory function stable ?Cardiovascular status: blood pressure returned to baseline and stable ?Postop Assessment: adequate PO intake ?Anesthetic complications: no ? ? ?No notable events documented. ? ? ?Last Vitals:  ?Vitals:  ? 02/28/22 0454 02/28/22 0715  ?BP: 105/75 104/76  ?Pulse: 78 79  ?Resp: 18 18  ?Temp: 36.9 ?C (!) 36.3 ?C  ?SpO2: 98% 96%  ?  ?Last Pain:  ?Vitals:  ? 02/28/22 0715  ?TempSrc:   ?PainSc: 8   ? ? ?  ?  ?  ?  ?  ?  ? ?Reed Breech ? ? ? ? ?

## 2022-02-28 NOTE — Progress Notes (Signed)
Initial Nutrition Assessment ? ?DOCUMENTATION CODES:  ? ?Obesity unspecified ? ?INTERVENTION:  ? ?Boost Breeze po TID, each supplement provides 250 kcal and 9 grams of protein ? ?Ensure Enlive po TID with diet advancement, each supplement provides 350 kcal and 20 grams of protein. ? ?Diverticulitis diet education  ? ?NUTRITION DIAGNOSIS:  ? ?Increased nutrient needs related to post-op healing as evidenced by estimated needs. ? ?GOAL:  ? ?Patient will meet greater than or equal to 90% of their needs ? ?MONITOR:  ? ?PO intake, Supplement acceptance, Labs, TF tolerance, Skin, I & O's, Diet advancement ? ?REASON FOR ASSESSMENT:  ? ?Malnutrition Screening Tool ?  ? ?ASSESSMENT:  ? ?56 y/o female with h/o HTN, COVID 19 (06/2019) and gluteal abscess who is s/p robotic assisted laparoscopic left hemicolectomy and LOA for chronic diverticulitis with abscess. ? ?Met with pt in room today. Pt reports that she is feeling ok today; pt's main complaint is incisional pain. Pt denies any flatus or BM yet but reports that she has been drinking some vegetable broth. RD discussed with pt the importance of adequate nutrition needed to support post-op healing. Pt is willing to drink mixed berry Boost Breeze or chocolate Ensure in hospital. RD will add supplements to help pt meet her estimated needs. Per chart, pt appears weight stable at baseline. RD provided patient with handouts with supporting information on diverticulitis diet education today.  ? ?Medications reviewed and include: lovenox, protonix, ertapenem ? ?Labs reviewed: K 4.2 wnl ?Hgb 9.5(L), Hct 31.2(L), MCV 66.1(L), MCH 20.1(L) ? ?NUTRITION - FOCUSED PHYSICAL EXAM: ? ?Flowsheet Row Most Recent Value  ?Orbital Region No depletion  ?Upper Arm Region No depletion  ?Thoracic and Lumbar Region No depletion  ?Buccal Region No depletion  ?Temple Region No depletion  ?Clavicle Bone Region No depletion  ?Clavicle and Acromion Bone Region No depletion  ?Scapular Bone Region No  depletion  ?Dorsal Hand No depletion  ?Patellar Region No depletion  ?Anterior Thigh Region No depletion  ?Posterior Calf Region No depletion  ?Edema (RD Assessment) None  ?Hair Reviewed  ?Eyes Reviewed  ?Mouth Reviewed  ?Skin Reviewed  ?Nails Reviewed  ? ?Diet Order:   ?Diet Order   ? ?       ?  Diet clear liquid Room service appropriate? Yes; Fluid consistency: Thin  Diet effective now       ?  ? ?  ?  ? ?  ? ?EDUCATION NEEDS:  ? ?Education needs have been addressed ? ?Skin:  Skin Assessment: Reviewed RN Assessment (incision abdomen) ? ?Last BM:  PTA ? ?Height:  ? ?Ht Readings from Last 1 Encounters:  ?02/27/22 _0  (1.549 m)  ? ? ?Weight:  ? ?Wt Readings from Last 1 Encounters:  ?02/27/22 81.6 kg  ? ? ?Ideal Body Weight:  47.7 kg ? ?BMI:  Body mass index is 33.99 kg/m?. ? ?Estimated Nutritional Needs:  ? ?Kcal:  1800-2100kcal/day ? ?Protein:  90-105g/day ? ?Fluid:  1.5-1.7L/day ? ?Koleen Distance MS, RD, LDN ?Please refer to [facility] for RD and/or RD on-call/weekend/after hours pager ? ?

## 2022-02-28 NOTE — Plan of Care (Signed)
?  Problem: Clinical Measurements: ?Goal: Ability to maintain clinical measurements within normal limits will improve ?Outcome: Progressing ?Goal: Will remain free from infection ?Outcome: Progressing ?Goal: Diagnostic test results will improve ?Outcome: Progressing ?Goal: Respiratory complications will improve ?Outcome: Progressing ?Goal: Cardiovascular complication will be avoided ?Outcome: Progressing ?  ?Problem: Pain Managment: ?Goal: General experience of comfort will improve ?Outcome: Progressing ?  ?Pt is alert and oriented. V/S stable. Complained of pain on her abdomen; Hydrocodone and IV Dilaudid given. Ice pack applied. JP drain in place. FC in place, draining good amount of urine.  ?

## 2022-03-01 LAB — CBC
HCT: 26.2 % — ABNORMAL LOW (ref 36.0–46.0)
Hemoglobin: 8.1 g/dL — ABNORMAL LOW (ref 12.0–15.0)
MCH: 20.3 pg — ABNORMAL LOW (ref 26.0–34.0)
MCHC: 30.9 g/dL (ref 30.0–36.0)
MCV: 65.7 fL — ABNORMAL LOW (ref 80.0–100.0)
Platelets: 267 10*3/uL (ref 150–400)
RBC: 3.99 MIL/uL (ref 3.87–5.11)
RDW: 18.1 % — ABNORMAL HIGH (ref 11.5–15.5)
WBC: 8.9 10*3/uL (ref 4.0–10.5)
nRBC: 0 % (ref 0.0–0.2)

## 2022-03-01 LAB — URINE CULTURE: Culture: NO GROWTH

## 2022-03-01 LAB — HEMOGLOBIN AND HEMATOCRIT, BLOOD
HCT: 25.5 % — ABNORMAL LOW (ref 36.0–46.0)
HCT: 25.7 % — ABNORMAL LOW (ref 36.0–46.0)
Hemoglobin: 7.8 g/dL — ABNORMAL LOW (ref 12.0–15.0)
Hemoglobin: 7.9 g/dL — ABNORMAL LOW (ref 12.0–15.0)

## 2022-03-01 LAB — BASIC METABOLIC PANEL
Anion gap: 6 (ref 5–15)
BUN: 11 mg/dL (ref 6–20)
CO2: 26 mmol/L (ref 22–32)
Calcium: 7.9 mg/dL — ABNORMAL LOW (ref 8.9–10.3)
Chloride: 105 mmol/L (ref 98–111)
Creatinine, Ser: 0.65 mg/dL (ref 0.44–1.00)
GFR, Estimated: >60 mL/min (ref >60)
Glucose, Bld: 92 mg/dL (ref 70–99)
Potassium: 3 mmol/L — ABNORMAL LOW (ref 3.5–5.1)
Sodium: 137 mmol/L (ref 135–145)

## 2022-03-01 LAB — SURGICAL PATHOLOGY

## 2022-03-01 NOTE — Plan of Care (Signed)

## 2022-03-01 NOTE — Progress Notes (Signed)
Pt is involved in and agrees with the plan of care. V/S stable. Complained of surgical pain in her abdomen; Hydrocodone given with relief. Reports feeling gassy; encouraged ambulation. Assisted to walk at hallway. Passing gas but no BM yet. Reports some blood after wiping herself clean and a blood clot. Will cont to monitor the pt.  ?

## 2022-03-01 NOTE — Progress Notes (Signed)
Subjective:  ?CC: ?Adrienne Adkins is a 56 y.o. female  Hospital stay day 2, 2 Days Post-Op robotic assisted laparoscopic left hemicolectomy for chronic diverticulitis with abscess ? ?HPI: ?More flatus today with some bloody discharge with it.  Pain is more right side today. ? ?ROS:  ?General: Denies weight loss, weight gain, fatigue, fevers, chills, and night sweats. ?Heart: Denies chest pain, palpitations, racing heart, irregular heartbeat, leg pain or swelling, and decreased activity tolerance. ?Respiratory: Denies breathing difficulty, shortness of breath, wheezing, cough, and sputum. ?GI: Denies change in appetite, heartburn, nausea, vomiting, constipation, diarrhea, and blood in stool. ?GU: Denies difficulty urinating, pain with urinating, urgency, frequency, blood in urine. ? ? ?Objective:  ? ?Temp:  [98.2 ?F (36.8 ?C)-98.7 ?F (37.1 ?C)] 98.2 ?F (36.8 ?C) (04/06 0813) ?Pulse Rate:  [72-80] 72 (04/06 0813) ?Resp:  [16-18] 18 (04/06 0813) ?BP: (83-106)/(54-59) 106/59 (04/06 0813) ?SpO2:  [91 %-100 %] 100 % (04/06 0813)     Height: 5\' 1"  (154.9 cm) Weight: 81.6 kg BMI (Calculated): 34.01  ? ?Intake/Output this shift:  ? ?Intake/Output Summary (Last 24 hours) at 03/01/2022 1116 ?Last data filed at 03/01/2022 1041 ?Gross per 24 hour  ?Intake 2844.85 ml  ?Output 540 ml  ?Net 2304.85 ml  ? ? ?Constitutional :  alert, cooperative, appears stated age, and no distress  ?Respiratory:  clear to auscultation bilaterally  ?Cardiovascular:  regular rate and rhythm  ?Gastrointestinal: Soft, no guarding, some TTP in epigastric around incision site, LLQ AND RLQ TTP as well . Drain with serosanguinous.  ?Skin: Cool and moist. Staples C/D/I.  ?Psychiatric: Normal affect, non-agitated, not confused  ?   ?  ?LABS:  ? ?  Latest Ref Rng & Units 03/01/2022  ?  6:28 AM 02/28/2022  ?  6:55 AM 02/27/2022  ?  7:16 PM  ?CMP  ?Glucose 70 - 99 mg/dL 92   95     ?BUN 6 - 20 mg/dL 11   16     ?Creatinine 0.44 - 1.00 mg/dL 04/29/2022   1.69   6.78    ?Sodium  135 - 145 mmol/L 137   138     ?Potassium 3.5 - 5.1 mmol/L 3.0   4.2     ?Chloride 98 - 111 mmol/L 105   104     ?CO2 22 - 32 mmol/L 26   27     ?Calcium 8.9 - 10.3 mg/dL 7.9   8.4     ? ? ?  Latest Ref Rng & Units 03/01/2022  ? 10:21 AM 03/01/2022  ?  6:28 AM 02/28/2022  ?  6:55 AM  ?CBC  ?WBC 4.0 - 10.5 K/uL  8.9   9.5    ?Hemoglobin 12.0 - 15.0 g/dL 7.8   8.1   9.5    ?Hematocrit 36.0 - 46.0 % 25.5   26.2   31.2    ?Platelets 150 - 400 K/uL  267   328    ? ? ?RADS: ?N/a ?Assessment:  ? ?S/p  robotic assisted laparoscopic left hemicolectomy for chronic diverticulitis with abscess. ? ?Still with  generalized pain, but otherwise pass flatus and tolerating clears. Will continue clears for now due to drop in hgb, repeat again later tonight. JP more serosanguinous this am, so reassuring. ? ?OOB to chair, ambulate in the meantime. ? ?labs/images/medications/previous chart entries reviewed personally and relevant changes/updates noted above. ? ? ?

## 2022-03-01 NOTE — Progress Notes (Signed)
Mobility Specialist - Progress Note ? ? 03/01/22 1500  ?Mobility  ?Activity Ambulated independently in hallway  ?Level of Assistance Independent  ?Assistive Device Other (Comment) ?(IV Pole)  ?Distance Ambulated (ft) 300 ft  ?Activity Response Tolerated well  ?$Mobility charge 1 Mobility  ? ? ?Pt standing at door upon arrival using RA. Pt completes ambulation indep with IV pole and voices mild abdominal pain. Pt returns to bed with needs in reach. ? ?Adrienne Adkins ?Mobility Specialist ?03/01/22, 3:16 PM ? ? ? ?

## 2022-03-01 NOTE — Plan of Care (Signed)
Pt is hypotensive bp-83/58 (map-66),HR-75). Rechecked bp-85/59(66), HR-72. Denies dizziness or lightheadedness. Oncall provider informed. IVF started as ordered.  ?

## 2022-03-02 MED ORDER — OXYCODONE HCL 5 MG PO TABS: 5.0000 mg | ORAL_TABLET | Freq: Four times a day (QID) | ORAL | 0 refills | Status: AC | PRN

## 2022-03-02 NOTE — Discharge Instructions (Signed)
Laparoscopic Colectomy, Care After ?This sheet gives you information about how to care for yourself after your procedure. Your health care provider may also give you more specific instructions. If you have problems or questions, contact your health care provider. ?What can I expect after the procedure? ?After your procedure, it is common to have the following: ?Pain in your abdomen, especially in the incision areas. You will be given medicine to control the pain. ?Tiredness. This is a normal part of the recovery process. Your energy level will return to normal over the next several weeks. ?Changes in your bowel movements, such as constipation or needing to go more often. Talk with your health care provider about how to manage this. ?Continue to monitor drain output characteristic and amount daily as instructed by RN. ?Follow these instructions at home: ?Medicines ? tylenol and alleve as needed for discomfort.  Please alternate between the two as needed for pain.   ? Use narcotics, if prescribed, only when tylenol and motrin is not enough to control pain. ? 325-650mg  every 8hrs to max of 4000mg /24hrs (including the 325mg  in every norco dose) for the tylenol.   ? Do not drive or use heavy machinery while taking prescription pain medicine. ?Do not drink alcohol while taking prescription pain medicine. ?If you were prescribed an antibiotic medicine, use it as told by your health care provider. Do not stop using the antibiotic even if you start to feel better. ?Incision care ? ?  ?Follow instructions from your health care provider about how to take care of your incision areas. Make sure you: ?Keep your incisions clean and dry. ?Wash your hands with soap and water before and after applying medicine to the areas, and before and after changing your bandage (dressing). If soap and water are not available, use hand sanitizer. ?Change your dressing as told by your health care provider. ?Leave stitches (sutures), skin glue, or  adhesive strips in place. These skin closures may need to stay in place for 2 weeks or longer. If adhesive strip edges start to loosen and curl up, you may trim the loose edges. Do not remove adhesive strips completely unless your health care provider tells you to do that. ?Do not wear tight clothing over the incisions. Tight clothing may rub and irritate the incision areas, which may cause the incisions to open. ?Do not take baths, swim, or use a hot tub until your health care provider approves. OK TO SHOWER.   ?Check your incision area every day for signs of infection. Check for: ?More redness, swelling, or pain. ?More fluid or blood. ?Warmth. ?Pus or a bad smell. ?Activity ?Avoid lifting anything that is heavier than 10 lb (4.5 kg) for 2 weeks or until your health care provider says it is okay. ?You may resume normal activities as told by your health care provider. Ask your health care provider what activities are safe for you. ?Take rest breaks during the day as needed. ?Eating and drinking ?Follow instructions from your health care provider about what you can eat after surgery. ?To prevent or treat constipation while you are taking prescription pain medicine, your health care provider may recommend that you: ?Drink enough fluid to keep your urine clear or pale yellow. ?Take over-the-counter or prescription medicines. ?Eat foods that are high in fiber, such as fresh fruits and vegetables, whole grains, and beans. ?Limit foods that are high in fat and processed sugars, such as fried and sweet foods. ?General instructions ?Ask your health care provider when  you will need an appointment to get your sutures or staples removed. ?Keep all follow-up visits as told by your health care provider. This is important. ?Contact a health care provider if: ?You have more redness, swelling, or pain around your incisions. ?You have more fluid or blood coming from the incisions. ?Your incisions feel warm to the touch. ?You have  pus or a bad smell coming from your incisions or your dressing. ?You have a fever. ?You have an incision that breaks open (edges not staying together) after sutures or staples have been removed. ?Get help right away if: ?You develop a rash. ?You have chest pain or difficulty breathing. ?You have pain or swelling in your legs. ?You feel light-headed or you faint. ?Your abdomen swells (becomes distended). ?You have nausea or vomiting. ?You have blood in your stool (feces). ?This information is not intended to replace advice given to you by your health care provider. Make sure you discuss any questions you have with your health care provider. ?Document Released: 06/01/2005 Document Revised: 08/01/2018 Document Reviewed: 08/13/2016 ?Elsevier Interactive Patient Education ? 2019 [place]. ?  ? ?

## 2022-03-02 NOTE — Plan of Care (Signed)
  Problem: Education: Goal: Knowledge of General Education information will improve Description Including pain rating scale, medication(s)/side effects and non-pharmacologic comfort measures Outcome: Progressing   

## 2022-03-02 NOTE — Plan of Care (Signed)
?  Problem: Clinical Measurements: ?Goal: Ability to maintain clinical measurements within normal limits will improve ?Outcome: Progressing ?Goal: Will remain free from infection ?Outcome: Progressing ?Goal: Diagnostic test results will improve ?Outcome: Progressing ?Goal: Respiratory complications will improve ?Outcome: Progressing ?Goal: Cardiovascular complication will be avoided ?Outcome: Progressing ?  ?Problem: Activity: ?Goal: Risk for activity intolerance will decrease ?Outcome: Progressing ?  ?Problem: Pain Managment: ?Goal: General experience of comfort will improve ?Outcome: Progressing ?  ?Pt is involved in and agrees with the plan of care. V/S stable. Passing gas but no BM yet. Hydrocodone given for surgical pain. Ambulates well in her room.  ?

## 2022-03-02 NOTE — Progress Notes (Addendum)
Subjective:  ?CC: ?Adrienne Adkins is a 56 y.o. female  Hospital stay day 3, 3 Days Post-Op robotic assisted laparoscopic left hemicolectomy for chronic diverticulitis with abscess ? ?HPI: ?Flatus and BM.  Pain is more localized to incisions per report. ? ?ROS:  ?General: Denies weight loss, weight gain, fatigue, fevers, chills, and night sweats. ?Heart: Denies chest pain, palpitations, racing heart, irregular heartbeat, leg pain or swelling, and decreased activity tolerance. ?Respiratory: Denies breathing difficulty, shortness of breath, wheezing, cough, and sputum. ?GI: Denies change in appetite, heartburn, nausea, vomiting, constipation, diarrhea, and blood in stool. ?GU: Denies difficulty urinating, pain with urinating, urgency, frequency, blood in urine. ? ? ?Objective:  ? ?Temp:  [98.1 ?F (36.7 ?C)-99.4 ?F (37.4 ?C)] 98.3 ?F (36.8 ?C) (04/07 2202) ?Pulse Rate:  [65-71] 66 (04/07 0722) ?Resp:  [18-20] 18 (04/07 0722) ?BP: (96-119)/(70-80) 119/77 (04/07 5427) ?SpO2:  [98 %-100 %] 100 % (04/07 0722)     Height: 5\' 1"  (154.9 cm) Weight: 81.6 kg BMI (Calculated): 34.01  ? ?Intake/Output this shift:  ? ?Intake/Output Summary (Last 24 hours) at 03/02/2022 1155 ?Last data filed at 03/02/2022 0900 ?Gross per 24 hour  ?Intake 1666.4 ml  ?Output 90 ml  ?Net 1576.4 ml  ? ? ?Constitutional :  alert, cooperative, appears stated age, and no distress  ?Respiratory:  clear to auscultation bilaterally  ?Cardiovascular:  regular rate and rhythm  ?Gastrointestinal:  Drain with serosanguinous fluid.  TTP still in four quadrants but improving.  ?Skin: Cool and moist. Staples C/D/I.  ?Psychiatric: Normal affect, non-agitated, not confused  ?   ?  ?LABS:  ? ?  Latest Ref Rng & Units 03/01/2022  ?  6:28 AM 02/28/2022  ?  6:55 AM 02/27/2022  ?  7:16 PM  ?CMP  ?Glucose 70 - 99 mg/dL 92   95     ?BUN 6 - 20 mg/dL 11   16     ?Creatinine 0.44 - 1.00 mg/dL 04/29/2022   0.62   3.76    ?Sodium 135 - 145 mmol/L 137   138     ?Potassium 3.5 - 5.1 mmol/L 3.0    4.2     ?Chloride 98 - 111 mmol/L 105   104     ?CO2 22 - 32 mmol/L 26   27     ?Calcium 8.9 - 10.3 mg/dL 7.9   8.4     ? ? ?  Latest Ref Rng & Units 03/01/2022  ?  7:32 PM 03/01/2022  ? 10:21 AM 03/01/2022  ?  6:28 AM  ?CBC  ?WBC 4.0 - 10.5 K/uL   8.9    ?Hemoglobin 12.0 - 15.0 g/dL 7.9   7.8   8.1    ?Hematocrit 36.0 - 46.0 % 25.7   25.5   26.2    ?Platelets 150 - 400 K/uL   267    ? ? ?RADS: ?N/a ?Assessment:  ? ?S/p  robotic assisted laparoscopic left hemicolectomy for chronic diverticulitis with abscess. ? ?Doing well post op.  Drain remains serosanguinous and output decreasing past couple days, incisions with no sign of infection. ? ?Tolerating full liquid.  Advance to regular diet and continue to encourage trainsition to oral pain meds prn and not to ask on regular intervals.  ? ?OOB to chair, ambulate.  Ok to d/c once tolerating regular diet and pain controlled with oral meds only. ? ?labs/images/medications/previous chart entries reviewed personally and relevant changes/updates noted above. ? ? ?

## 2022-03-02 NOTE — Progress Notes (Signed)
PT Cancellation Note ? ?Patient Details ?Name: Adrienne Adkins ?MRN: [id number] ?DOB: [birth year] ? ? ?Cancelled Treatment:    Reason Eval/Treat Not Completed: PT screened, no needs identified, will sign off (Consult received and chart reviewed. Patient mobilizing indep with mobility specialist; no acute PT needs identified.  Will complete initial order. Please re-consult should needs change.) ? ?Farron Watrous H. Owens Shark, PT, DPT, NCS ?03/02/22, 2:30 PM ?[phone] ? ?

## 2022-03-02 NOTE — Progress Notes (Signed)
Mobility Specialist - Progress Note ? ? 03/02/22 1418  ?Mobility  ?Activity Ambulated independently in hallway;Stood at bedside;Dangled on edge of bed  ?Level of Assistance Independent  ?Assistive Device None  ?Distance Ambulated (ft) 320 ft  ?Activity Response Tolerated well  ?$Mobility charge 1 Mobility  ? ? ?Pt supine upon arrival using RA. Pt completes bed mobility to sit EOB, STS and ambulates 351ft indep voicing mild abdominal pain. Pt left EOB with needs in reach. ? ?Clarisa Schools ?Mobility Specialist ?03/02/22, 2:20 PM ? ? ? ? ?

## 2022-03-03 NOTE — Discharge Summary (Signed)
?  Patient ID: ?Luvenia Redden ?MRN: [id number] ?DOB/AGE: 1966/04/14 56 y.o. ? ?Admit date: 02/27/2022 ?Discharge date: 03/03/2022 ? ? ?Discharge Diagnoses:  ?Principal Problem: ?  Diverticulitis large intestine ? ? ?Procedures: Robotic assisted laparoscopic partial colectomy ? ?Hospital Course: Patient with chronic complicated diverticulitis.  She was admitted for partial colectomy.  She underwent robotic assisted laparoscopic partial colectomy.  She tolerated the procedure well.  She has been recovering adequately.  This morning patient was sitting down on the chair feeling very comfortable.  She is passing gas and having bowel movement.  She has been tolerating regular diet.  She is ambulating well.  Pain is controlled with oral pain medications.  She said that she has not received any IV medications since 2 or 3 days ago. ? ?Physical Exam ?HENT:  ?   Head: Normocephalic.  ?Cardiovascular:  ?   Rate and Rhythm: Normal rate and regular rhythm.  ?   Pulses: Normal pulses.  ?Pulmonary:  ?   Effort: Pulmonary effort is normal.  ?Abdominal:  ?   General: Abdomen is flat.  ?Musculoskeletal:  ?   Cervical back: Normal range of motion.  ?Skin: ?   General: Skin is warm.  ?   Capillary Refill: Capillary refill takes less than 2 seconds.  ?Neurological:  ?   General: No focal deficit present.  ?   Mental Status: She is alert and oriented to person, place, and time.  ? ? ? ?Consults: None ? ?Disposition: Discharge disposition: 01-Home or Self Care ? ? ? ? ? ? ?Discharge Instructions   ? ? Diet - low sodium heart healthy   Complete by: As directed ?  ? Increase activity slowly   Complete by: As directed ?  ? ?  ? ?Allergies as of 03/03/2022   ? ?   Reactions  ? Ciprofloxacin Hcl Hives  ? Metronidazole Hives  ? Beta Adrenergic Blockers Other (See Comments)  ? Prolonged P waves on EKG  ? Amoxicillin Hives, Rash  ? Sulfa Antibiotics Hives  ? ?  ? ?  ?Medication List  ?  ? ?STOP taking these medications   ? ?HYDROcodone-acetaminophen  5-325 MG tablet ?Commonly known as: Norco ?  ?levofloxacin 500 MG tablet ?Commonly known as: LEVAQUIN ?  ? ?  ? ?TAKE these medications   ? ?amLODipine 2.5 MG tablet ?Commonly known as: NORVASC ?Take 2.5 mg by mouth daily. ?  ?EPINEPHrine 0.3 mg/0.3 mL Soaj injection ?Commonly known as: EPI-PEN ?Inject 0.3 mg into the muscle as needed for anaphylaxis. ?  ?naproxen 500 MG tablet ?Commonly known as: NAPROSYN ?Take 500 mg by mouth 2 (two) times daily as needed for moderate pain. ?  ?oxyCODONE 5 MG immediate release tablet ?Commonly known as: Roxicodone ?Take 1 tablet (5 mg total) by mouth every 6 (six) hours as needed for severe pain (not relieved by other meds). ?  ?pantoprazole 40 MG tablet ?Commonly known as: PROTONIX ?Take 40 mg by mouth daily. ?  ?valsartan-hydrochlorothiazide 320-25 MG tablet ?Commonly known as: DIOVAN-HCT ?Take 1 tablet by mouth daily. ?  ? ?  ? ? Follow-up Information   ? ? Sakai, Isami, DO Follow up in 1 week(s).   ?Specialty: Surgery ?Why: post op colon resection, drain removal ?Contact information: ?[address] ?[place] [address] ?[phone] ? ? ?  ?  ? ?  ?  ? ?  ? ? ?

## 2022-03-03 NOTE — Plan of Care (Signed)

## 2022-03-03 NOTE — Progress Notes (Signed)
Adrienne Adkins to be D/C'd Home per MD order.  Discussed prescriptions and follow up appointments with the patient. Prescriptions given to patient, medication list explained in detail. Pt verbalized understanding. ? ?Allergies as of 03/03/2022   ? ?   Reactions  ? Ciprofloxacin Hcl Hives  ? Metronidazole Hives  ? Beta Adrenergic Blockers Other (See Comments)  ? Prolonged P waves on EKG  ? Amoxicillin Hives, Rash  ? Sulfa Antibiotics Hives  ? ?  ? ?  ?Medication List  ?  ? ?STOP taking these medications   ? ?HYDROcodone-acetaminophen 5-325 MG tablet ?Commonly known as: Norco ?  ?levofloxacin 500 MG tablet ?Commonly known as: LEVAQUIN ?  ? ?  ? ?TAKE these medications   ? ?amLODipine 2.5 MG tablet ?Commonly known as: NORVASC ?Take 2.5 mg by mouth daily. ?  ?EPINEPHrine 0.3 mg/0.3 mL Soaj injection ?Commonly known as: EPI-PEN ?Inject 0.3 mg into the muscle as needed for anaphylaxis. ?  ?naproxen 500 MG tablet ?Commonly known as: NAPROSYN ?Take 500 mg by mouth 2 (two) times daily as needed for moderate pain. ?  ?oxyCODONE 5 MG immediate release tablet ?Commonly known as: Roxicodone ?Take 1 tablet (5 mg total) by mouth every 6 (six) hours as needed for severe pain (not relieved by other meds). ?  ?pantoprazole 40 MG tablet ?Commonly known as: PROTONIX ?Take 40 mg by mouth daily. ?  ?valsartan-hydrochlorothiazide 320-25 MG tablet ?Commonly known as: DIOVAN-HCT ?Take 1 tablet by mouth daily. ?  ? ?  ? ? ?Vitals:  ? 03/02/22 1929 03/03/22 0729  ?BP: 115/73 (!) 142/99  ?Pulse: 67 69  ?Resp: 17 18  ?Temp: 98.2 ?F (36.8 ?C) 98.5 ?F (36.9 ?C)  ?SpO2: 98% 97%  ? ? ?Skin clean, dry and intact without evidence of skin break down, no evidence of skin tears noted. IV catheter discontinued intact. Site without signs and symptoms of complications. Dressing and pressure applied. Pt denies pain at this time. No complaints noted. ? ?An After Visit Summary was printed and given to the patient. ?Patient escorted via WC, and D/C home via  private auto. ? ?Madie Reno, RN  ? ?

## 2023-02-13 ENCOUNTER — Other Ambulatory Visit: Payer: Self-pay | Admitting: Internal Medicine

## 2023-02-13 DIAGNOSIS — Z1231 Encounter for screening mammogram for malignant neoplasm of breast: Secondary | ICD-10-CM

## 2023-06-24 ENCOUNTER — Other Ambulatory Visit: Payer: Self-pay | Admitting: Physician Assistant

## 2023-06-24 DIAGNOSIS — K859 Acute pancreatitis without necrosis or infection, unspecified: Secondary | ICD-10-CM

## 2023-06-24 DIAGNOSIS — R109 Unspecified abdominal pain: Secondary | ICD-10-CM

## 2023-06-25 ENCOUNTER — Ambulatory Visit
Admission: RE | Admit: 2023-06-25 | Discharge: 2023-06-25 | Disposition: A | Discharge status: Home / Self Care | Payer: 59 | Source: Ambulatory Visit | Attending: Physician Assistant | Admitting: Physician Assistant

## 2023-06-25 DIAGNOSIS — R109 Unspecified abdominal pain: Secondary | ICD-10-CM | POA: Insufficient documentation

## 2023-06-25 DIAGNOSIS — K859 Acute pancreatitis without necrosis or infection, unspecified: Secondary | ICD-10-CM | POA: Insufficient documentation

## 2023-06-25 MED ORDER — IOHEXOL 300 MG/ML  SOLN
100.0000 mL | Freq: Once | INTRAMUSCULAR | Status: AC | PRN
  Administered 2023-06-25: 100 mL via INTRAVENOUS

## 2024-01-28 IMAGING — CT CT ABD-PELV W/ CM
2 of 5 series · 16 of 46 positions shown, 18 images · IV contrast (APPLIED)
Comparison: 11/10/2021

CLINICAL DATA: Left lower quadrant pain for 1 week, history of
diverticulitis with drainage initial encounter

EXAM:
CT ABDOMEN AND PELVIS WITH CONTRAST
TECHNIQUE: Multidetector CT imaging of the abdomen and pelvis was performed
using the standard protocol following bolus administration of
intravenous contrast.

[Series 2: routine abd/pel with · axial · 0.89mm/px · z∈[-485,-130]mm · 13 of 81 slices shown, 15 images]
[im 5/81  soft-tissue]
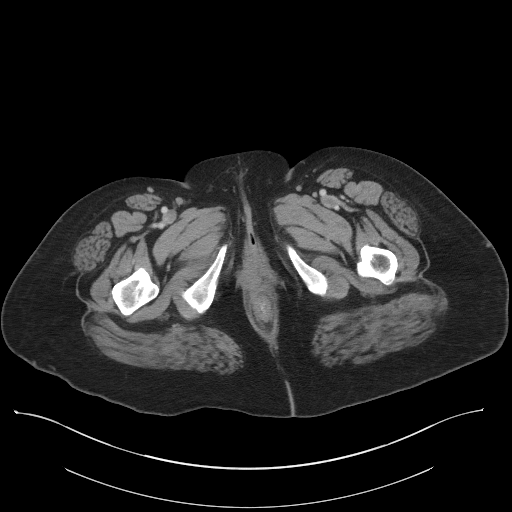
[im 5/81  bone]
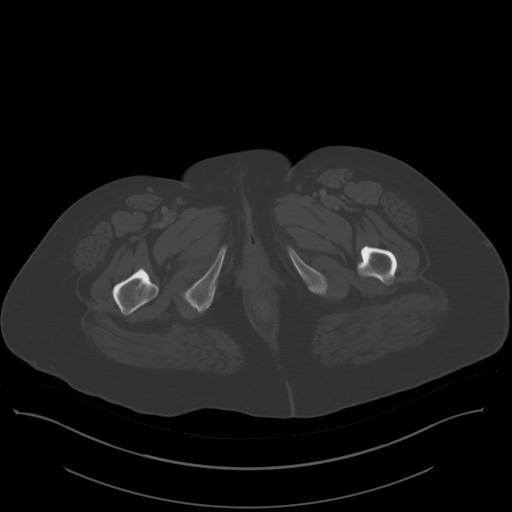
[im 10/81  soft-tissue]
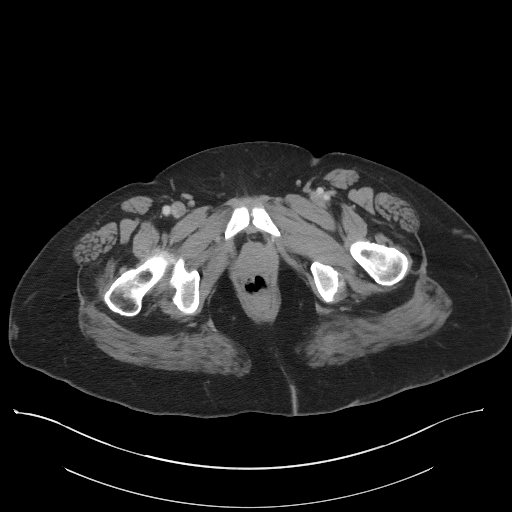
[im 19/81  soft-tissue]
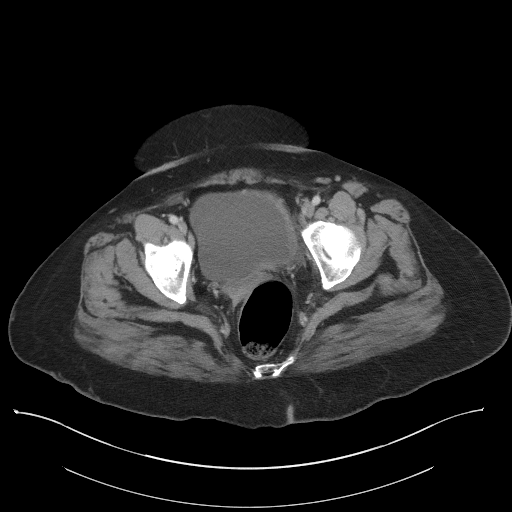
[im 24/81  soft-tissue]
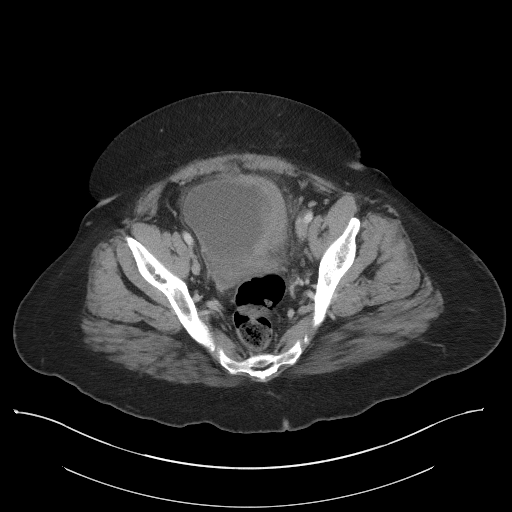
[im 29/81  soft-tissue]
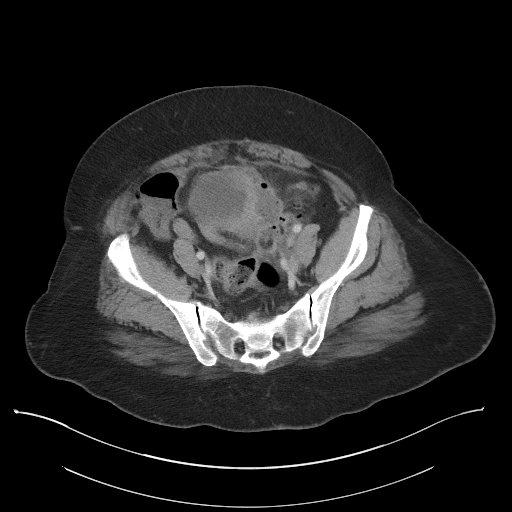
[im 33/81  soft-tissue]
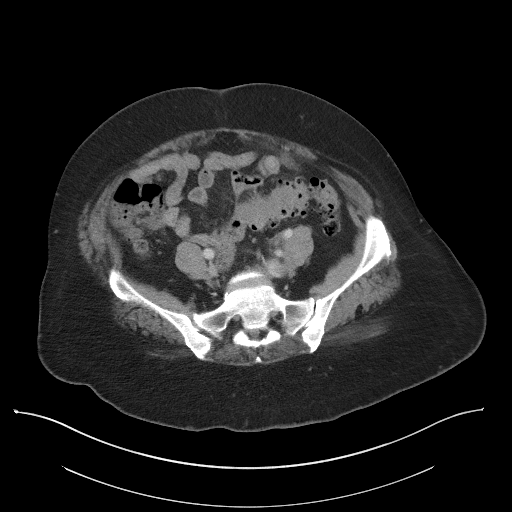
[im 43/81  soft-tissue]
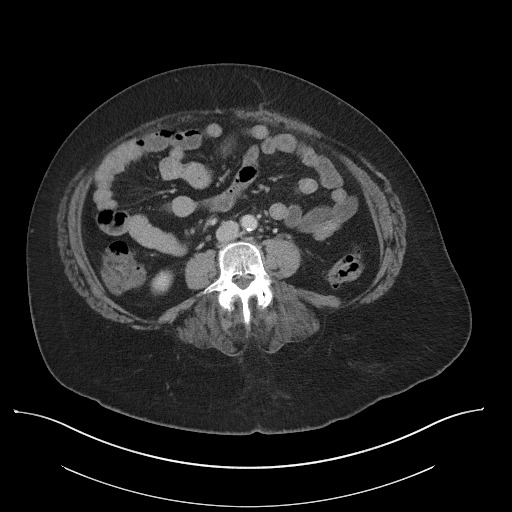
[im 48/81  soft-tissue]
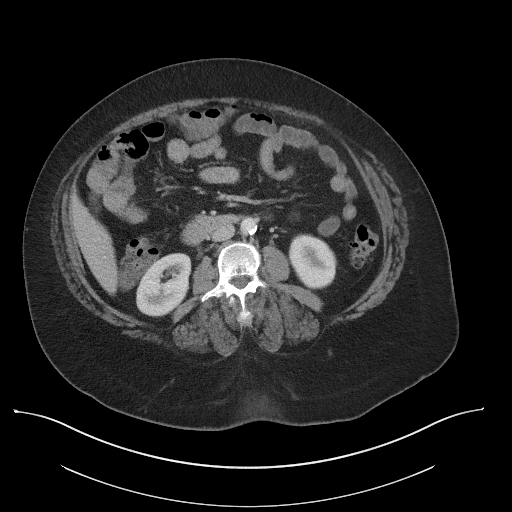
[im 52/81  soft-tissue]
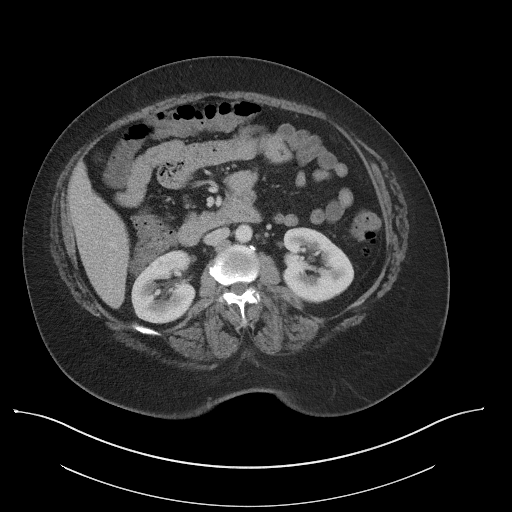
[im 52/81  bone]
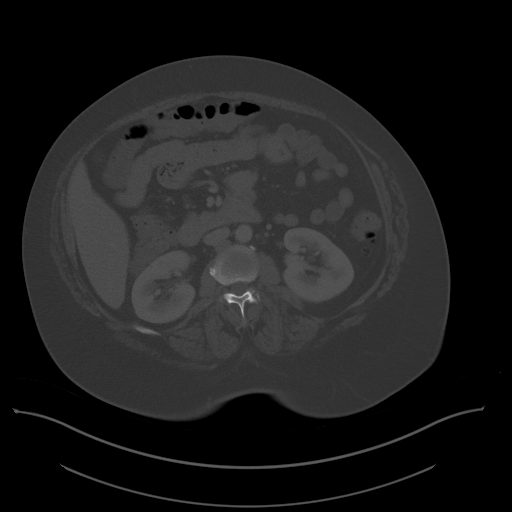
[im 57/81  soft-tissue]
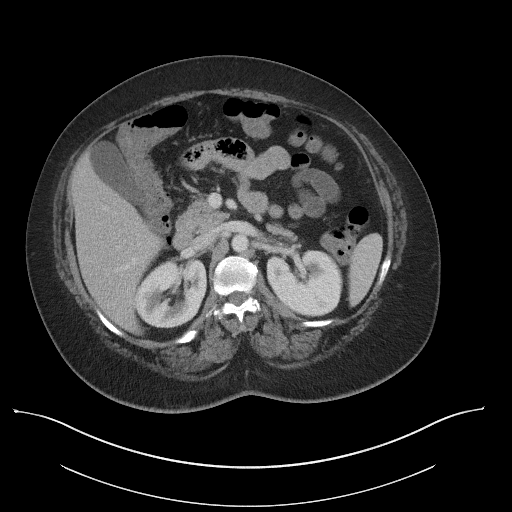
[im 62/81  soft-tissue]
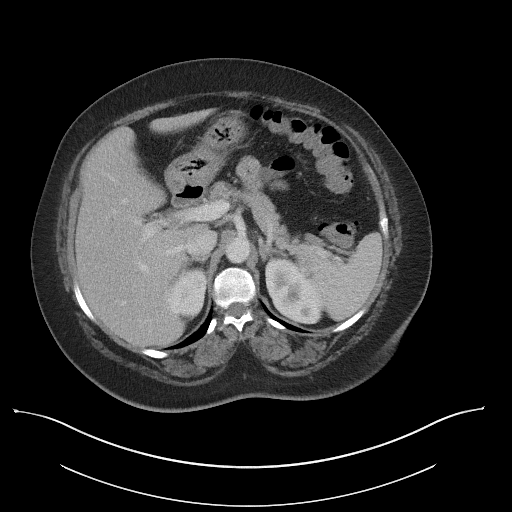
[im 71/81  soft-tissue]
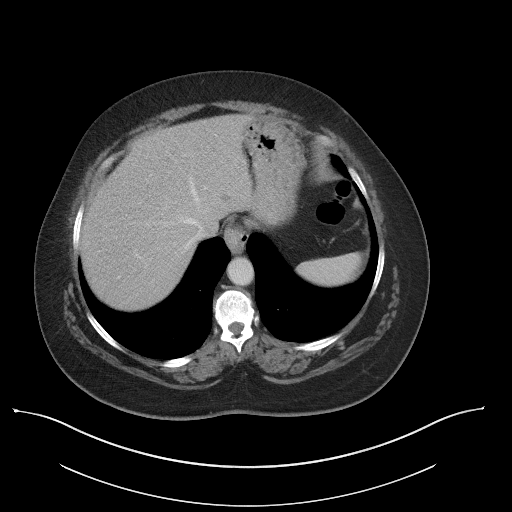
[im 76/81  soft-tissue]
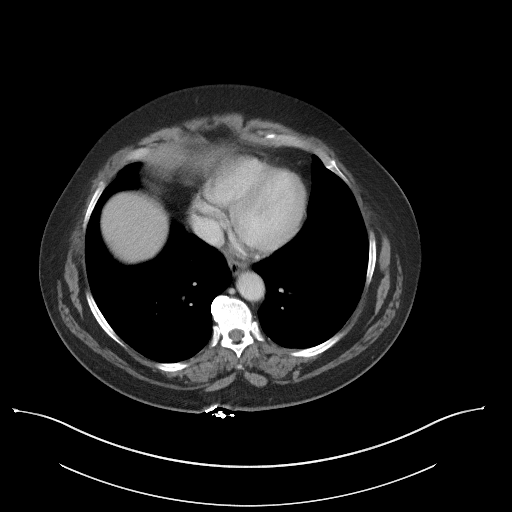

[Series 5: coronal st · coronal · 0.66mm/px · 3 of 95 slices shown]
[im 32/95  soft-tissue]
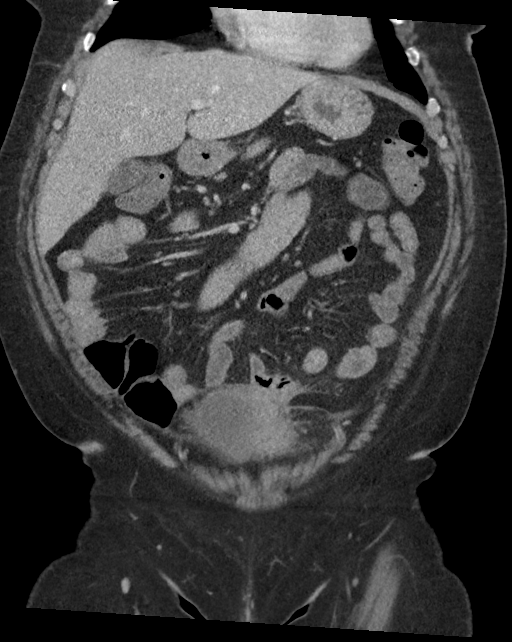
[im 42/95  soft-tissue]
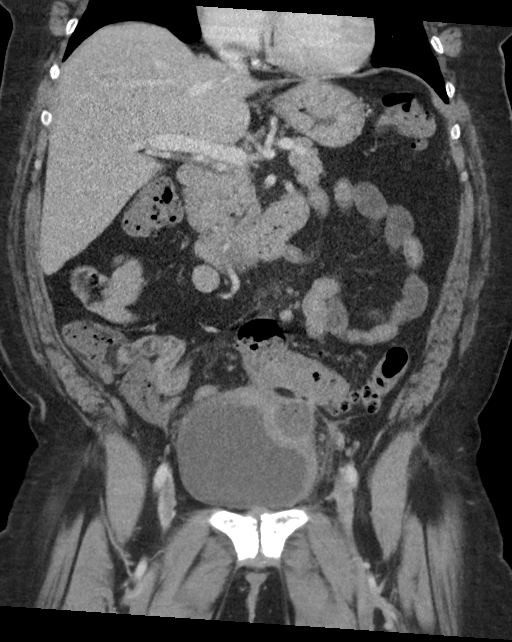
[im 53/95  soft-tissue]
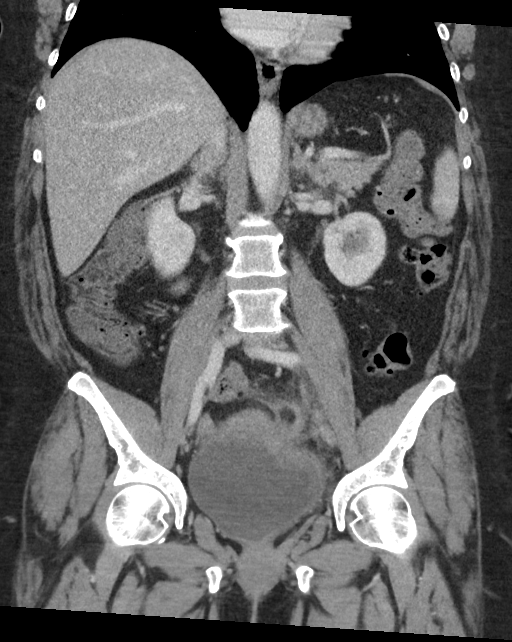

[16 of 46 positions shown; findings below may reference images not displayed]

RADIATION DOSE REDUCTION: This exam was performed according to the
departmental dose-optimization program which includes automated
exposure control, adjustment of the mA and/or kV according to
patient size and/or use of iterative reconstruction technique.

CONTRAST:  100mL OMNIPAQUE IOHEXOL 300 MG/ML  SOLN
FINDINGS: Lower chest: No acute abnormality.

Hepatobiliary: No focal liver abnormality is seen. No gallstones,
gallbladder wall thickening, or biliary dilatation.

Pancreas: Unremarkable. No pancreatic ductal dilatation or
surrounding inflammatory changes.

Spleen: Normal in size without focal abnormality.

Adrenals/Urinary Tract: Adrenal glands are within normal limits.
Kidneys demonstrate a normal enhancement pattern bilaterally. No
renal calculi or obstructive changes are noted. Bladder is again
well distended. Thickening of the left lateral and superior wall is
noted with adjacent abscess similar to that seen on prior exam from
10/27/2021. It currently measures 5.8 x 2.8 cm in greatest AP and
transverse dimensions respectively.

Stomach/Bowel: Diverticular change of the sigmoid colon is again
identified which contributes to the abscess along the left aspect of
the bladder. More proximal colon is within normal limits. The
appendix has been surgically removed. Small bowel and stomach are
within normal limits.

Vascular/Lymphatic: Aortic atherosclerosis. No enlarged abdominal or
pelvic lymph nodes.

Reproductive: Status post hysterectomy. No adnexal masses.

Other: No abdominal wall hernia or abnormality. No abdominopelvic
ascites.

Musculoskeletal: No acute or significant osseous findings.
IMPRESSION: Changes consistent with recurrent abscess adjacent to the left
lateral and superior aspect of the urinary bladder. This would again
be amenable to percutaneous drainage. No definitive fistulization
into the bladder is noted at this time. Adjacent changes of
diverticulitis are identified in the sigmoid colon.

No other focal abnormality is noted.
